# Patient Record
Sex: Female | Born: 1975 | Race: White | Hispanic: No | Marital: Married | Smoking: Never smoker
Health system: Southern US, Community
[De-identification: ages and names within clinical notes are randomized; demographics above are authoritative.]

## PROBLEM LIST (undated history)

## (undated) DIAGNOSIS — K219 Gastro-esophageal reflux disease without esophagitis: Secondary | ICD-10-CM

## (undated) HISTORY — PX: AUGMENTATION MAMMAPLASTY: SUR837

## (undated) HISTORY — DX: Gastro-esophageal reflux disease without esophagitis: K21.9

---

## 2003-12-04 HISTORY — PX: AUGMENTATION MAMMAPLASTY: SUR837

## 2004-12-03 HISTORY — PX: HEMORRHOID SURGERY: SHX153

## 2005-04-19 ENCOUNTER — Other Ambulatory Visit: Admission: RE | Admit: 2005-04-19 | Discharge: 2005-04-19 | Payer: Self-pay | Admitting: Obstetrics and Gynecology

## 2005-06-11 ENCOUNTER — Ambulatory Visit (HOSPITAL_COMMUNITY): Admission: RE | Admit: 2005-06-11 | Discharge: 2005-06-11 | Payer: Self-pay | Admitting: General Surgery

## 2005-06-17 ENCOUNTER — Emergency Department (HOSPITAL_COMMUNITY): Admission: EM | Admit: 2005-06-17 | Discharge: 2005-06-17 | Payer: Self-pay | Admitting: Emergency Medicine

## 2005-12-03 HISTORY — PX: LAPAROSCOPIC CHOLECYSTECTOMY: SUR755

## 2005-12-03 HISTORY — PX: GALLBLADDER SURGERY: SHX652

## 2013-03-16 ENCOUNTER — Ambulatory Visit (INDEPENDENT_AMBULATORY_CARE_PROVIDER_SITE_OTHER): Payer: No Typology Code available for payment source | Admitting: Obstetrics and Gynecology

## 2013-03-16 ENCOUNTER — Encounter: Payer: Self-pay | Admitting: *Deleted

## 2013-03-16 ENCOUNTER — Encounter: Payer: Self-pay | Admitting: Obstetrics and Gynecology

## 2013-03-16 VITALS — BP 110/60 | Ht 63.0 in | Wt 132.0 lb

## 2013-03-16 DIAGNOSIS — Z0000 Encounter for general adult medical examination without abnormal findings: Secondary | ICD-10-CM

## 2013-03-16 DIAGNOSIS — Z01419 Encounter for gynecological examination (general) (routine) without abnormal findings: Secondary | ICD-10-CM

## 2013-03-16 MED ORDER — NORGESTREL-ETHINYL ESTRADIOL 0.3-30 MG-MCG PO TABS: 1.0000 | ORAL_TABLET | Freq: Every day | ORAL | Status: DC

## 2013-03-16 NOTE — Patient Instructions (Signed)

## 2013-03-16 NOTE — Progress Notes (Signed)
37 y.o.  Married  Caucasian female   G2P2002 here for annual exam.  On lo-ovral.  Was on Loestrin 1.5/30 but had heavy bleeding and changed about a year ago to lo-ovral, menses currently fairly normal flow, only one heavy day, 3 light days.    Patient's last menstrual period was 03/09/2013.          Sexually active: yes  The current method of family planning is OCP (estrogen/progesterone).husband has a vasectomy Exercising: cardio,runner, interval training 6 days a week Last mammogram:  never Last pap smear:01/05/11 neg History of abnormal pap: no Smoking:no Alcohol: occ alcohol (mix drink) Last colonoscopy:never Last Bone Density: never  Last tetanus shot: not sure Last cholesterol check: not sure  Hgb:  At blood drive 12/6107         Urine:neg    No health maintenance topics applied.  Family History  Problem Relation Age of Onset  . Hypertension Father   . Ovarian cancer Maternal Aunt     both sisters died of ovarian ca/ mother neg  . Alzheimer's disease Maternal Grandmother   . Alzheimer's disease Maternal Grandfather   . Heart failure Paternal Grandfather     There is no problem list on file for this patient.   History reviewed. No pertinent past medical history.  Past Surgical History  Procedure Laterality Date  . Augmentation mammaplasty      2005  . Hemorrhoid surgery  2006  . Gallbladder surgery  2007    Allergies: Review of patient's allergies indicates no known allergies.  Current Outpatient Prescriptions  Medication Sig Dispense Refill  . CRYSELLE-28 0.3-30 MG-MCG tablet       . fluticasone (FLONASE) 50 MCG/ACT nasal spray       . Multiple Vitamin (MULTI-VITAMINS PO) Take by mouth daily.      . Probiotic Product (PROBIOTIC DAILY PO) Take by mouth daily.       No current facility-administered medications for this visit.    ROS: Pertinent items are noted in HPI.  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 , boys are in 4th and 8 th  grade  Exam:    BP 110/60  Ht 5\' 3"  (1.6 m)  Wt 132 lb (59.875 kg)  BMI 23.39 kg/m2  LMP 03/09/2013  Up one pound from last year Wt Readings from Last 3 Encounters:  03/16/13 132 lb (59.875 kg)     Ht Readings from Last 3 Encounters:  03/16/13 5\' 3"  (1.6 m)    General appearance: alert, cooperative and appears stated age Head: Normocephalic, without obvious abnormality, atraumatic Neck: no adenopathy, supple, symmetrical, trachea midline and thyroid not enlarged, symmetric, no tenderness/mass/nodules Lungs: clear to auscultation bilaterally Breasts: Inspection negative, No nipple retraction or dimpling, No nipple discharge or bleeding, No axillary or supraclavicular adenopathy, Normal to palpation without dominant masses Heart: regular rate and rhythm Abdomen: soft, non-tender; bowel sounds normal; no masses,  no organomegaly Extremities: extremities normal, atraumatic, no cyanosis or edema Skin: Skin color, texture, turgor normal. No rashes or lesions Lymph nodes: Cervical, supraclavicular, and axillary nodes normal. No abnormal inguinal nodes palpated Neurologic: Grossly normal   Pelvic: External genitalia:  no lesions              Urethra:  normal appearing urethra with no masses, tenderness or lesions              Bartholins and Skenes: normal  Vagina: normal appearing vagina with normal color and discharge, no lesions              Cervix: normal appearance              Pap taken: no        Bimanual Exam:  Uterus:  uterus is normal size, shape, consistency and nontender, midposition, mobile                                      Adnexa: normal adnexa in size, nontender and no masses                                                                          Anus:  normal sphincter tone, no lesions  A: normal gyn exam, OC's     P: mammogram counseled on breast self exam, adequate intake of calcium and vitamin D, diet and exercise return annually or prn      An After Visit Summary was printed and given to the patient.

## 2013-04-13 ENCOUNTER — Other Ambulatory Visit: Payer: Self-pay | Admitting: *Deleted

## 2013-04-13 NOTE — Telephone Encounter (Signed)
Faxed refill request received from pharmacy for 90 day supply Lo/Ovral Last filled by MD on 05/01/12, thru 01/2013 Last AEX - 01/28/12 Next AEX - not scheduled Please advise refills.  Chart on your desk.  Thanks.

## 2013-04-13 NOTE — Telephone Encounter (Signed)
No refills.  Return fax to pharmacy telling them to tell pt to call the office to schedule her AnEx.

## 2013-04-14 NOTE — Telephone Encounter (Signed)
Left patient a message to call office and schedule appt.

## 2013-04-15 ENCOUNTER — Telehealth: Payer: Self-pay | Admitting: *Deleted

## 2013-04-15 NOTE — Telephone Encounter (Signed)
I left patient a message to call and schedule with the front desk staff for her annual exam she is overdue and [facility] has sent a request to refill her script.

## 2013-04-16 ENCOUNTER — Encounter: Payer: Self-pay | Admitting: Obstetrics and Gynecology

## 2013-04-28 NOTE — Telephone Encounter (Signed)
Pt returned call.  Call documented in 04/15/13 note.

## 2014-03-19 ENCOUNTER — Ambulatory Visit: Payer: No Typology Code available for payment source | Admitting: Obstetrics and Gynecology

## 2014-03-19 ENCOUNTER — Encounter: Payer: Self-pay | Admitting: Obstetrics and Gynecology

## 2014-05-03 ENCOUNTER — Telehealth: Payer: Self-pay | Admitting: Obstetrics and Gynecology

## 2014-05-03 NOTE — Telephone Encounter (Signed)
I will send to Dr Edward Jolly. Since she doesn't have any clue as to when she "canceled" it i dont think she really did.  Dr Edward Jolly: do you want to waive this fee or does the fee apply?

## 2014-05-03 NOTE — Telephone Encounter (Signed)
Patient is calling about a no show charge she got. She says she has called about this before but never heard anything back from anyone. She claims she called and canceled the appointment but doesn't have any idea when she did that. Said she canceled because her insurance changed.

## 2014-05-03 NOTE — Telephone Encounter (Signed)
Can you tell me if this was approved by Dr. Edward Jolly to be waived?

## 2014-05-05 NOTE — Telephone Encounter (Signed)
I waived the fee and called the patient to let her know.

## 2014-05-05 NOTE — Telephone Encounter (Signed)
Remove the cancel fee, and make a note in the computer regarding this.

## 2014-05-05 NOTE — Telephone Encounter (Signed)
Emily Winters here is Dr Courtney Paris answer

## 2014-10-04 ENCOUNTER — Encounter: Payer: Self-pay | Admitting: Obstetrics and Gynecology

## 2014-12-03 HISTORY — PX: OTHER SURGICAL HISTORY: SHX169

## 2016-02-22 ENCOUNTER — Encounter: Payer: Self-pay | Admitting: Physician Assistant

## 2016-02-22 ENCOUNTER — Ambulatory Visit (INDEPENDENT_AMBULATORY_CARE_PROVIDER_SITE_OTHER): Payer: BLUE CROSS/BLUE SHIELD | Admitting: Physician Assistant

## 2016-02-22 VITALS — BP 120/72 | HR 69 | Ht 62.0 in | Wt 129.0 lb

## 2016-02-22 DIAGNOSIS — Z131 Encounter for screening for diabetes mellitus: Secondary | ICD-10-CM | POA: Diagnosis not present

## 2016-02-22 DIAGNOSIS — M222X2 Patellofemoral disorders, left knee: Secondary | ICD-10-CM

## 2016-02-22 DIAGNOSIS — K2100 Gastro-esophageal reflux disease with esophagitis, without bleeding: Secondary | ICD-10-CM

## 2016-02-22 DIAGNOSIS — Z13220 Encounter for screening for lipoid disorders: Secondary | ICD-10-CM | POA: Diagnosis not present

## 2016-02-22 DIAGNOSIS — M222X9 Patellofemoral disorders, unspecified knee: Secondary | ICD-10-CM | POA: Insufficient documentation

## 2016-02-22 MED ORDER — RANITIDINE HCL 300 MG PO TABS: 300.0000 mg | ORAL_TABLET | Freq: Two times a day (BID) | ORAL | Status: DC

## 2016-02-22 MED ORDER — OMEPRAZOLE 20 MG PO CPDR: 20.0000 mg | DELAYED_RELEASE_CAPSULE | Freq: Every day | ORAL | Status: DC

## 2016-02-22 MED ORDER — DICLOFENAC SODIUM 2 % TD SOLN: TRANSDERMAL | Status: DC

## 2016-02-22 NOTE — Progress Notes (Signed)
Subjective:    Patient ID: Emily Winters, female    DOB: [birth year], 40 y.o.   MRN: [id number]  HPI Pt is a 40 yo female who presents to the clinic to establish care.  .. Active Ambulatory Problems    Diagnosis Date Noted  . 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 Gastroesophageal reflux disease with esophagitis 02/22/2016   Resolved Ambulatory Problems    Diagnosis Date Noted  . No Resolved Ambulatory Problems   No Additional Past Medical History   .Marland Kitchen. Family History  Problem Relation Age of Onset  . Hypertension Father   . Alcohol abuse Father   . Cancer Father   . Hyperlipidemia Father   . Ovarian cancer Maternal Aunt     both sisters died of ovarian ca/ mother neg  . Alcohol abuse Maternal Aunt   . Cancer Maternal Aunt   . Alzheimer's disease Maternal Grandmother   . Alzheimer's disease Maternal Grandfather   . Alcohol abuse Maternal Grandfather   . Heart failure Paternal Grandfather   . Heart attack Paternal Grandfather   . Stroke Paternal Grandfather   . Hyperlipidemia Mother   . Crohn's disease Brother   . Cancer Maternal Aunt    .Marland Kitchen. Social History   Social History  . Marital Status: Married    Spouse Name: N/A  . Number of Children: N/A  . Years of Education: N/A   Occupational History  . Not on file.   Social History Main Topics  . Smoking status: Never Smoker   . Smokeless tobacco: Not on file  . Alcohol Use: 0.5 oz/week    1 Standard drinks or equivalent per week     Comment: occ alcohol(mix drink)  . Drug Use: No  . Sexual Activity:    Partners: Male    Birth Control/ Protection: Pill     Comment: lo ovral 28 day and husband has a vacectomy   Other Topics Concern  . Not on file   Social History Narrative   ** Merged History Encounter **       Pt needs refills on omeprazole for GERD. She has been out and symptoms not controlled. She has heard negative things about PPI long term and has questions.   She does have off and on knee  pain. Left worse than right. Has seen ortho and said it was patellofemoral syndrome. She continues to do crossfit and run. Uses ibuprofen often and does help.    Review of Systems  All other systems reviewed and are negative.      Objective:   Physical Exam  Constitutional: She is oriented to person, place, and time. She appears well-developed and well-nourished.  HENT:  Head: Normocephalic and atraumatic.  Cardiovascular: Normal rate, regular rhythm and normal heart sounds.   Pulmonary/Chest: Effort normal and breath sounds normal.  Neurological: She is alert and oriented to person, place, and time.  Psychiatric: She has a normal mood and affect. Her behavior is normal.          Assessment & Plan:  GERD- refilled omperazole. Discussed to use as needed. Try zantac at dinner and can take twice a day. If controls can get off omeprazole. Concerned NSAID use could be worsening reflux. Stop NSAIDs. Sent topical for patient to try for knee pain.   Patellofemoral syndrome- continue using support of knee with exercising. Stop oral NSAIDs. See if helps reflux. Try topical pennsaid bid. Exercises as needed. Follow up as needed.   Screening lipid and cmp ordered.  Pap and OB done at [facility] Dr. Cliffton Asters. Will call to get records.

## 2016-02-22 NOTE — Patient Instructions (Signed)
Patellofemoral Pain Syndrome  Patellofemoral pain syndrome is a condition that involves a softening or breakdown of the tissue (cartilage) on the underside of your kneecap (patella). This causes pain in the front of the knee. The condition is also called runner's knee or chondromalacia patella. Patellofemoral pain syndrome is most common in young adults who are active in sports.  Your knee is the largest joint in your body. The patella covers the front of your knee and is attached to muscles above and below your knee. The underside of the patella is covered with a smooth type of cartilage (synovium). The smooth surface helps the patella glide easily when you move your knee. Patellofemoral pain syndrome causes swelling in the joint linings and bone surfaces in your knee.   CAUSES   Patellofemoral pain syndrome can be caused by:   Overuse.   Poor alignment of your knee joints.   Weak leg muscles.   A direct blow to your kneecap.  RISK FACTORS  You may be at risk for patellofemoral pain syndrome if you:   Do a lot of activities that can wear down your kneecap. These include:    Running.    Squatting.    Climbing stairs.   Start a new physical activity or exercise program.   Wear shoes that do not fit well.   Do not have good leg strength.   Are overweight.  SIGNS AND SYMPTOMS   Knee pain is the most common symptom of patellofemoral pain syndrome. This may feel like a dull, aching pain underneath your patella, in the front of your knee. There may be a popping or cracking sound when you move your knee. Pain may get worse with:   Exercise.   Climbing stairs.   Running.   Jumping.   Squatting.   Kneeling.   Sitting for a long time.   Moving or pushing on your patella.  DIAGNOSIS   Your health care provider may be able to diagnose patellofemoral pain syndrome from your symptoms and medical history. You may be asked about your recent physical activities and which ones cause knee pain. Your health care  provider may do a physical exam with certain tests to confirm the diagnosis. These may include:   Moving your patella back and forth.   Checking your range of knee motion.   Having you squat or jump to see if you have pain.   Checking the strength of your leg muscles.  An MRI of the knee may also be done.  TREATMENT   Patellofemoral pain syndrome can usually be treated at home with rest, ice, compression, and elevation (RICE). Other treatments may include:   Nonsteroidal anti-inflammatory drugs (NSAIDs).   Physical therapy to stretch and strengthen your leg muscles.   Shoe inserts (orthotics) to take stress off your knee.   A knee brace or knee support.   Surgery to remove damaged cartilage or move the patella to a better position. The need for surgery is rare.  HOME CARE INSTRUCTIONS    Take medicines only as directed by your health care provider.   Rest your knee.   When resting, keep your knee raised above the level of your heart.   Avoid activities that cause knee pain.   Apply ice to the injured area:   Put ice in a plastic bag.   Place a towel between your skin and the bag.   Leave the ice on for 20 minutes, 2-3 times a day.   Use splints,   braces, knee supports, or walking aids as directed by your health care provider.   Perform stretching and strengthening exercises as directed by your health care provider or physical therapist.   Keep all follow-up visits as directed by your health care provider. This is important.  SEEK MEDICAL CARE IF:    Your symptoms get worse.   You are not improving with home care.  MAKE SURE YOU:   Understand these instructions.   Will watch your condition.   Will get help right away if you are not doing well or get worse.     This information is not intended to replace advice given to you by your health care provider. Make sure you discuss any questions you have with your health care provider.     Document Released: 11/07/2009 Document Revised: 12/10/2014  Document Reviewed: 02/08/2014  Elsevier Interactive Patient Education 2016 [employer]

## 2016-03-20 ENCOUNTER — Other Ambulatory Visit: Payer: Self-pay | Admitting: Physician Assistant

## 2016-03-27 ENCOUNTER — Encounter: Payer: Self-pay | Admitting: Physician Assistant

## 2016-06-06 ENCOUNTER — Other Ambulatory Visit: Payer: Self-pay | Admitting: Physician Assistant

## 2016-08-16 LAB — COMPLETE METABOLIC PANEL WITH GFR
ALT: 23 U/L (ref 6–29)
AST: 27 U/L (ref 10–30)
Albumin: 4.7 g/dL (ref 3.6–5.1)
Alkaline Phosphatase: 56 U/L (ref 33–115)
BUN: 14 mg/dL (ref 7–25)
CALCIUM: 9.7 mg/dL (ref 8.6–10.2)
CHLORIDE: 104 mmol/L (ref 98–110)
CO2: 26 mmol/L (ref 20–31)
CREATININE: 1 mg/dL (ref 0.50–1.10)
GFR, Est African American: 81 mL/min (ref >=60)
GFR, Est Non African American: 70 mL/min (ref >=60)
GLUCOSE: 92 mg/dL (ref 65–99)
Potassium: 4.3 mmol/L (ref 3.5–5.3)
SODIUM: 140 mmol/L (ref 135–146)
Total Bilirubin: 1.8 mg/dL — ABNORMAL HIGH (ref 0.2–1.2)
Total Protein: 7.1 g/dL (ref 6.1–8.1)

## 2016-08-16 LAB — LIPID PANEL
CHOLESTEROL: 180 mg/dL (ref 125–200)
HDL: 60 mg/dL (ref >=46)
LDL CALC: 109 mg/dL (ref <130)
Total CHOL/HDL Ratio: 3 Ratio (ref <=5.0)
Triglycerides: 54 mg/dL (ref <150)
VLDL: 11 mg/dL (ref <30)

## 2016-08-28 ENCOUNTER — Telehealth: Payer: Self-pay | Admitting: *Deleted

## 2016-08-28 DIAGNOSIS — Z1239 Encounter for other screening for malignant neoplasm of breast: Secondary | ICD-10-CM

## 2016-08-28 NOTE — Telephone Encounter (Signed)
Mammo ordered.

## 2016-08-29 ENCOUNTER — Other Ambulatory Visit: Payer: Self-pay | Admitting: Physician Assistant

## 2016-11-07 ENCOUNTER — Ambulatory Visit: Payer: BLUE CROSS/BLUE SHIELD | Admitting: Family Medicine

## 2016-11-08 ENCOUNTER — Encounter: Payer: Self-pay | Admitting: Emergency Medicine

## 2016-11-08 ENCOUNTER — Emergency Department
Admission: EM | Admit: 2016-11-08 | Discharge: 2016-11-08 | Disposition: A | Discharge status: Home / Self Care | Payer: BLUE CROSS/BLUE SHIELD | Source: Home / Self Care | Attending: Family Medicine | Admitting: Family Medicine

## 2016-11-08 DIAGNOSIS — J0100 Acute maxillary sinusitis, unspecified: Secondary | ICD-10-CM

## 2016-11-08 MED ORDER — AMOXICILLIN-POT CLAVULANATE 875-125 MG PO TABS: 1.0000 | ORAL_TABLET | Freq: Two times a day (BID) | ORAL | 0 refills | Status: DC

## 2016-11-08 NOTE — ED Triage Notes (Signed)
12 days, Sinus pain, pressure, headache, cough, ears hurt

## 2016-11-08 NOTE — ED Provider Notes (Signed)
CSN: [id number]     Arrival date & time 11/08/16  1844 History   First MD Initiated Contact with Patient 11/08/16 1859     Chief Complaint  Patient presents with  . Sinus Problem   (Consider location/radiation/quality/duration/timing/severity/associated sxs/prior Treatment) HPI  Emily Winters is a 40 y.o. female presenting to UC with c/o persistent sinus pain and pressure for about 12 days, associated mild intermittent productive cough, generalized headache and bilateral ear pain and pressure. She has taken OTC cough/cold medications with mild to moderate relief.  She notes others have been sick around her for Thanksgiving.  Denies fever, chills, n/v/d. Hx of sinus infections a few years ago.    History reviewed. No pertinent past medical history. Past Surgical History:  Procedure Laterality Date  . AUGMENTATION MAMMAPLASTY Bilateral 2005  . AUGMENTATION MAMMAPLASTY     2005  . GALLBLADDER SURGERY  2007  . HEMORRHOID SURGERY  2006  . LAPAROSCOPIC CHOLECYSTECTOMY  2007  . uterine ablation  2016   Family History  Problem Relation Age of Onset  . Hypertension Father   . Alcohol abuse Father   . Cancer Father   . Hyperlipidemia Father   . Ovarian cancer Maternal Aunt     both sisters died of ovarian ca/ mother neg  . Alcohol abuse Maternal Aunt   . Cancer Maternal Aunt   . Alzheimer's disease Maternal Grandmother   . Alzheimer's disease Maternal Grandfather   . Alcohol abuse Maternal Grandfather   . Heart failure Paternal Grandfather   . Heart attack Paternal Grandfather   . Stroke Paternal Grandfather   . Hyperlipidemia Mother   . Crohn's disease Brother   . Cancer Maternal Aunt    Social History  Substance Use Topics  . Smoking status: Never Smoker  . Smokeless tobacco: Never Used  . Alcohol use 0.5 oz/week    1 Standard drinks or equivalent per week     Comment: occ alcohol(mix drink)   OB History    Gravida Para Term Preterm AB Living   4 4 4     2    SAB TAB  Ectopic Multiple Live Births                 Review of Systems  Constitutional: Negative for chills and fever.  HENT: Positive for congestion, sinus pain and sinus pressure. Negative for ear pain, sore throat, trouble swallowing and voice change.   Respiratory: Positive for cough. Negative for shortness of breath.   Cardiovascular: Negative for chest pain and palpitations.  Gastrointestinal: Negative for abdominal pain, diarrhea, nausea and vomiting.  Musculoskeletal: Negative for arthralgias, back pain and myalgias.  Skin: Negative for rash.  Neurological: Positive for headaches ( frontal). Negative for dizziness and light-headedness.    Allergies  Patient has no known allergies.  Home Medications   Prior to Admission medications   Medication Sig Start Date End Date Taking? Authorizing Provider  amoxicillin-clavulanate (AUGMENTIN) 875-125 MG tablet Take 1 tablet by mouth 2 (two) times daily. One po bid x 7 days 11/08/16   Junius FinnerErin O'Malley, PA-C  Cholecalciferol (VITAMIN D-3) 1000 units CAPS Take by mouth daily.    Historical Provider, MD  Misc Natural Products (GLUCOSAMINE CHONDROITIN MSM PO) Take by mouth 3 (three) times daily.    Historical Provider, MD  Multiple Vitamin (MULTI-VITAMINS PO) Take by mouth daily.    Historical Provider, MD  omeprazole (PRILOSEC) 20 MG capsule Take 1 capsule (20 mg total) by mouth daily. 02/22/16   Lesly RubensteinJade  L Breeback, PA-C  PENNSAID 2 % SOLN APPLY TOPICALLY TWICE DAILY TO THE AFFECTED KNEES 03/20/16   Jade L Breeback, PA-C  Probiotic Product (PROBIOTIC DAILY PO) Take by mouth daily.    Historical Provider, MD  ranitidine (ZANTAC) 300 MG tablet TAKE 1 TABLET(300 MG) BY MOUTH TWICE DAILY 08/29/16   Jade L Breeback, PA-C  Turmeric 500 MG CAPS Take by mouth 2 (two) times daily.    Historical Provider, MD   Meds Ordered and Administered this Visit  Medications - No data to display  BP 128/86 (BP Location: Left Arm)   Pulse 64   Temp 97.8 F (36.6 C) (Oral)    Ht 5\' 2"  (1.575 m)   Wt 126 lb (57.2 kg)   SpO2 100%   BMI 23.05 kg/m  No data found.   Physical Exam  Constitutional: She appears well-developed and well-nourished. No distress.  HENT:  Head: Normocephalic and atraumatic.  Right Ear: Tympanic membrane normal.  Left Ear: Tympanic membrane normal.  Nose: Mucosal edema present. Right sinus exhibits maxillary sinus tenderness. Right sinus exhibits no frontal sinus tenderness. Left sinus exhibits maxillary sinus tenderness. Left sinus exhibits no frontal sinus tenderness.  Mouth/Throat: Uvula is midline, oropharynx is clear and moist and mucous membranes are normal.  Eyes: Conjunctivae are normal. No scleral icterus.  Neck: Normal range of motion. Neck supple.  Cardiovascular: Normal rate, regular rhythm and normal heart sounds.   Pulmonary/Chest: Effort normal and breath sounds normal. No stridor. No respiratory distress. She has no wheezes. She has no rales.  Abdominal: Soft. She exhibits no distension. There is no tenderness.  Musculoskeletal: Normal range of motion.  Lymphadenopathy:    She has no cervical adenopathy.  Neurological: She is alert.  Skin: Skin is warm and dry. She is not diaphoretic.  Nursing note and vitals reviewed.   Urgent Care Course   Clinical Course     Procedures (including critical care time)  Labs Review Labs Reviewed - No data to display  Imaging Review No results found.    MDM   1. Acute non-recurrent maxillary sinusitis    Pt c/o 12 days of worsening sinus pain, pressure and congestion. Sinus tenderness on exam. Rx: augmentin Home care instructions provided. F/u with PCP in 7-10 days as needed.    Junius Finnerrin O'Malley, PA-C 11/08/16 2003

## 2016-11-28 ENCOUNTER — Other Ambulatory Visit: Payer: Self-pay | Admitting: Physician Assistant

## 2017-11-15 ENCOUNTER — Encounter: Payer: Self-pay | Admitting: Physician Assistant

## 2017-11-15 ENCOUNTER — Ambulatory Visit (INDEPENDENT_AMBULATORY_CARE_PROVIDER_SITE_OTHER): Payer: 59 | Admitting: Physician Assistant

## 2017-11-15 VITALS — BP 125/79 | HR 79 | Ht 62.0 in | Wt 134.0 lb

## 2017-11-15 DIAGNOSIS — R5383 Other fatigue: Secondary | ICD-10-CM | POA: Diagnosis not present

## 2017-11-15 DIAGNOSIS — G5601 Carpal tunnel syndrome, right upper limb: Secondary | ICD-10-CM | POA: Diagnosis not present

## 2017-11-15 DIAGNOSIS — Z79899 Other long term (current) drug therapy: Secondary | ICD-10-CM

## 2017-11-15 DIAGNOSIS — Z1231 Encounter for screening mammogram for malignant neoplasm of breast: Secondary | ICD-10-CM | POA: Diagnosis not present

## 2017-11-15 DIAGNOSIS — N760 Acute vaginitis: Secondary | ICD-10-CM

## 2017-11-15 MED ORDER — RANITIDINE HCL 300 MG PO TABS: ORAL_TABLET | ORAL | 3 refills | Status: DC

## 2017-11-15 MED ORDER — FLUCONAZOLE 150 MG PO TABS: 150.0000 mg | ORAL_TABLET | Freq: Once | ORAL | 0 refills | Status: AC

## 2017-11-15 NOTE — Progress Notes (Addendum)
Subjective:    Patient ID: Emily Winters, female    DOB: 04/11/1976, 41 y.o.   MRN: [id number]  HPI  Patient is a 41 y/o female who present for numbness and tingling in her right hand. She states that it started about 3 years ago as numbness and tingling in her hand and arm when sleeping, however the symptoms were relieved with change in position. Lately she has not had relief with a change in position and reports numbness and tingling in her right middle finger. She describes it as worse when she is on the phone or sleeping. She denies pain or weakness in her right hand or fingers, as well as numbness or tingling from her neck down her arm, pain from her neck to her arm, or elbow pain. She reports neck pain but states that she has always had neck pain and does not attribute her symptoms to this. She has not tried OTC NSAIDs or braces. Her mother has a history of cubital tunnel syndrome for which she had cubital tunnel release.  Patient also reports history of yeast infections, particularly in the fall. She reports itching and white discharge. She has done 2 monostat treatments recently and reports some relief of symptoms but that it typically returns within a week. The last time she used monostat was last night.  Patient also reports decreased energy and would to have labs drawn to assess her fatigue.  .. Active Ambulatory Problems    Diagnosis Date Noted  . 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 Gastroesophageal reflux disease with esophagitis 02/22/2016   Resolved Ambulatory Problems    Diagnosis Date Noted  . No Resolved Ambulatory Problems   No Additional Past Medical History     Review of Systems See HPI, all other systems reviewed are negative.    Objective:   Physical Exam  Constitutional: She is oriented to person, place, and time. She appears well-developed and well-nourished.  HENT:  Head: Normocephalic and atraumatic.  Neck: Normal range of motion.  Grip strength  and sensation normal in bilateral hands.  Musculoskeletal:  Right wrist: Inspection normal with no visible erythema or swelling. ROM normal with good flexion and extension. Palpation is normal without tenderness. Negative tinel's over the right median nerve and positive phalen's which illicits numbness/tingling in her right middle finger.  Neurological: She is alert and oriented to person, place, and time.  Skin: Skin is warm and dry. No rash noted. No erythema.  Psychiatric: She has a normal mood and affect. Her behavior is normal. Thought content normal.      Assessment & Plan:  Marland Kitchen.Marland Kitchen.Diagnoses and all orders for this visit:  Visit for screening mammogram -     MM SCREENING BREAST TOMO BILATERAL  No energy -     COMPLETE METABOLIC PANEL WITH GFR -     CBC -     C-reactive protein -     Ferritin -     Sedimentation rate -     TSH -     Vitamin B12 -     Vit D  25 hydroxy (rtn osteoporosis monitoring)  Medication management -     COMPLETE METABOLIC PANEL WITH GFR  Acute vaginitis -     fluconazole (DIFLUCAN) 150 MG tablet; Take 1 tablet (150 mg total) by mouth once for 1 dose. Repeat in 48-72 hours if symptoms.  Other orders -     ranitidine (ZANTAC) 300 MG tablet; TAKE 1 TABLET(300 MG) BY MOUTH TWICE DAILY  Carpal tunnel syndrome - Suspect carpal tunnel syndrome because the patient is having numbness and tingling in her right middle finger that originates in her wrist but no symptoms in her right arm or neck. Ibuprofen 800 mg 3 times daily for relief of pain and symptoms. Advised patient to use night splints while sleeping to prevent wrist flexion and exacerbation of symptoms. Discussed that if she continues to have symptoms that we can refer her for EMG to determine confirm which nerves are affected. Also discussed referral to sports medicine for steroid injections or hydrodissection of the carpal tunnel if she does not experience relief of symptoms with conservative measures. Patient  will follow-up with sports medicine in 4 weeks if she does not have relief of symptoms.   Recurrent yeast infections - Patient reports yearly yeast infections in the fall and is currently symptomatic despite 2 treatments with OTC monostat. Prescribed Diflucan and advised patient to take one tablet, if her symptoms do not resolve with one dose then she can take a second dose 48-72 hrs later.

## 2017-11-15 NOTE — Patient Instructions (Signed)
Carpal Tunnel Syndrome Carpal tunnel syndrome is a condition that causes pain in your hand and arm. The carpal tunnel is a narrow area that is on the palm side of your wrist. Repeated wrist motion or certain diseases may cause swelling in the tunnel. This swelling can pinch the main nerve in the wrist (median nerve). Follow these instructions at home: If you have a splint:  Wear it as told by your doctor. Remove it only as told by your doctor.  Loosen the splint if your fingers: ? Become numb and tingle. ? Turn blue and cold.  Keep the splint clean and dry. General instructions  Take over-the-counter and prescription medicines only as told by your doctor.  Rest your wrist from any activity that may be causing your pain. If needed, talk to your employer about changes that can be made in your work, such as getting a wrist pad to use while typing.  If directed, apply ice to the painful area: ? Put ice in a plastic bag. ? Place a towel between your skin and the bag. ? Leave the ice on for 20 minutes, 2-3 times per day.  Keep all follow-up visits as told by your doctor. This is important.  Do any exercises as told by your doctor, physical therapist, or occupational therapist. Contact a doctor if:  You have new symptoms.  Medicine does not help your pain.  Your symptoms get worse. This information is not intended to replace advice given to you by your health care provider. Make sure you discuss any questions you have with your health care provider. Document Released: 11/08/2011 Document Revised: 04/26/2016 Document Reviewed: 04/06/2015 Elsevier Interactive Patient Education  2018 Elsevier Inc.  

## 2017-11-17 DIAGNOSIS — G5601 Carpal tunnel syndrome, right upper limb: Secondary | ICD-10-CM | POA: Insufficient documentation

## 2018-12-16 ENCOUNTER — Telehealth: Payer: Self-pay

## 2018-12-16 MED ORDER — FAMOTIDINE 20 MG PO TABS: 20.0000 mg | ORAL_TABLET | Freq: Two times a day (BID) | ORAL | 3 refills | Status: DC

## 2018-12-16 NOTE — Telephone Encounter (Signed)
Patient called today in need of a refill on Zantac. Due to recent recall, Zantac cannot be refilled. PCP advised and approved a prescription of Pepcid 20 mg be called in her pharmacy. Prescription was ordered and patient has been notified of medication change, and the reason for the change.   No further questions or concerns at this time.

## 2019-10-18 ENCOUNTER — Other Ambulatory Visit: Payer: Self-pay | Admitting: Physician Assistant

## 2019-10-27 ENCOUNTER — Other Ambulatory Visit: Payer: Self-pay

## 2019-10-27 ENCOUNTER — Ambulatory Visit (INDEPENDENT_AMBULATORY_CARE_PROVIDER_SITE_OTHER): Payer: 59 | Admitting: Physician Assistant

## 2019-10-27 VITALS — BP 129/76 | HR 73 | Ht 62.0 in | Wt 134.0 lb

## 2019-10-27 DIAGNOSIS — Z1231 Encounter for screening mammogram for malignant neoplasm of breast: Secondary | ICD-10-CM

## 2019-10-27 DIAGNOSIS — Z13220 Encounter for screening for lipoid disorders: Secondary | ICD-10-CM

## 2019-10-27 DIAGNOSIS — Z23 Encounter for immunization: Secondary | ICD-10-CM

## 2019-10-27 DIAGNOSIS — R160 Hepatomegaly, not elsewhere classified: Secondary | ICD-10-CM | POA: Diagnosis not present

## 2019-10-27 NOTE — Patient Instructions (Signed)
Please get labs and will order liver ultrasound.

## 2019-10-27 NOTE — Progress Notes (Signed)
Subjective:    Patient ID: Emily Winters, female    DOB: [birth year], 43 y.o.   MRN: [id number]  HPI  Patient is a 43 year old female with GERD who presents the clinic to discuss a abnormal finding on her MRI of lumbar spine.  She has been having some low back pain in her orthopedic specialist ordered an MRI.  Incidentally it also found a rather large liver measuring 23 cm.  Orthopedist suggested follow-up.  Patient has not been in the clinic in quite a while.  She has not had any fasting labs.  She denies any fatigue, leg swelling, abdominal bloating, itching, rashes.  She denies any excessive Tylenol usage.  She opts to use more NSAIDs.  She denies any significant alcohol intake.  She drinks on average 3-4 drinks a week.  She denies any past or present IV drug use.  She is in a monogamous relationship.  She denies any family history of liver disease.  She does not have any known history of hepatitis.   .. Active Ambulatory Problems    Diagnosis Date Noted  . 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 Gastroesophageal reflux disease with esophagitis 02/22/2016  . 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 Hepatomegaly 10/28/2019   Resolved Ambulatory Problems    Diagnosis Date Noted  . No Resolved Ambulatory Problems   No Additional Past Medical History      Review of Systems  All other systems reviewed and are negative.      Objective:   Physical Exam Vitals signs reviewed.  Constitutional:      Appearance: Normal appearance.  HENT:     Head: Normocephalic and atraumatic.  Cardiovascular:     Rate and Rhythm: Normal rate and regular rhythm.     Pulses: Normal pulses.  Pulmonary:     Effort: Pulmonary effort is normal.     Breath sounds: Normal breath sounds.  Abdominal:     General: Abdomen is flat. Bowel sounds are normal.     Palpations: Abdomen is soft.     Comments: Hepatomegaly.   Musculoskeletal:     Right lower leg: No edema.     Left lower leg:  No edema.  Skin:    Findings: No rash.  Neurological:     General: No focal deficit present.     Mental Status: She is alert and oriented to person, place, and time.  Psychiatric:        Mood and Affect: Mood normal.           Assessment & Plan:  Marland KitchenMarland KitchenSeona was seen today for advice only.  Diagnoses and all orders for this visit:  Hepatomegaly -     Hepatitis, Acute -     Hepatic function panel -     BASIC METABOLIC PANEL WITH GFR -     CBC -     PT/INR -     US Abdomen Complete -     Lipid Panel w/reflex Direct LDL  Need for Tdap vaccination -     TDAP  Encounter for screening mammogram for malignant neoplasm of breast -     MM 3D SCREEN BREAST BILATERAL  Screening for lipid disorders -     Lipid Panel w/reflex Direct LDL   Unclear etiology of increased liver size on MRI. No prior images to compare too. No past liver enzyme elevation. We will get a dedicated ultrasound of the abdomen to look at liver.  We will get some labs to  check for hepatitis, will get liver function, with good kidney function, CBC and anticoagulation.  Could be a normal variant Riedel's nodule.  Patient does not have any increased risk factors for cirrhosis such as obesity, alcohol, overuse of Tylenol.  Patient is asymptomatic at this time.  We will work this up and follow-up as needed.  Pt has not been seen in a while ordered screening lipid.

## 2019-10-28 ENCOUNTER — Encounter: Payer: Self-pay | Admitting: Physician Assistant

## 2019-10-28 DIAGNOSIS — R160 Hepatomegaly, not elsewhere classified: Secondary | ICD-10-CM | POA: Insufficient documentation

## 2019-11-04 ENCOUNTER — Other Ambulatory Visit: Payer: Self-pay

## 2019-11-04 ENCOUNTER — Ambulatory Visit (INDEPENDENT_AMBULATORY_CARE_PROVIDER_SITE_OTHER): Payer: 59

## 2019-11-04 DIAGNOSIS — R160 Hepatomegaly, not elsewhere classified: Secondary | ICD-10-CM | POA: Diagnosis not present

## 2019-11-04 NOTE — Progress Notes (Signed)
GREAT news. No evidence of enlarged liver. Normal liver.

## 2019-11-05 LAB — HEPATIC FUNCTION PANEL
AG Ratio: 2 (calc) (ref 1.0–2.5)
ALT: 13 U/L (ref 6–29)
AST: 19 U/L (ref 10–35)
Albumin: 4.5 g/dL (ref 3.6–5.1)
Alkaline phosphatase (APISO): 39 U/L (ref 31–125)
Bilirubin, Direct: 0.3 mg/dL — ABNORMAL HIGH (ref 0.0–0.2)
Globulin: 2.2 g/dL (calc) (ref 1.9–3.7)
Indirect Bilirubin: 1.4 mg/dL (calc) — ABNORMAL HIGH (ref 0.2–1.2)
Total Bilirubin: 1.7 mg/dL — ABNORMAL HIGH (ref 0.2–1.2)
Total Protein: 6.7 g/dL (ref 6.1–8.1)

## 2019-11-05 LAB — BASIC METABOLIC PANEL WITH GFR
BUN: 12 mg/dL (ref 7–25)
CO2: 26 mmol/L (ref 20–32)
Calcium: 9.9 mg/dL (ref 8.6–10.2)
Chloride: 105 mmol/L (ref 98–110)
Creat: 1.03 mg/dL (ref 0.50–1.10)
GFR, Est African American: 76 mL/min/{1.73_m2} (ref >=60)
GFR, Est Non African American: 66 mL/min/{1.73_m2} (ref >=60)
Glucose, Bld: 89 mg/dL (ref 65–99)
Potassium: 4.5 mmol/L (ref 3.5–5.3)
Sodium: 140 mmol/L (ref 135–146)

## 2019-11-05 LAB — HEPATITIS PANEL, ACUTE
Hep A IgM: NONREACTIVE
Hep B C IgM: NONREACTIVE
Hepatitis B Surface Ag: NONREACTIVE
Hepatitis C Ab: NONREACTIVE
SIGNAL TO CUT-OFF: 0.01 (ref <1.00)

## 2019-11-05 LAB — CBC
HCT: 43.2 % (ref 35.0–45.0)
Hemoglobin: 14.5 g/dL (ref 11.7–15.5)
MCH: 31.6 pg (ref 27.0–33.0)
MCHC: 33.6 g/dL (ref 32.0–36.0)
MCV: 94.1 fL (ref 80.0–100.0)
MPV: 10.6 fL (ref 7.5–12.5)
Platelets: 259 10*3/uL (ref 140–400)
RBC: 4.59 10*6/uL (ref 3.80–5.10)
RDW: 11.3 % (ref 11.0–15.0)
WBC: 5 10*3/uL (ref 3.8–10.8)

## 2019-11-05 LAB — LIPID PANEL W/REFLEX DIRECT LDL
Cholesterol: 178 mg/dL (ref <200)
HDL: 62 mg/dL (ref >=50)
LDL Cholesterol (Calc): 101 mg/dL (calc) — ABNORMAL HIGH
Non-HDL Cholesterol (Calc): 116 mg/dL (calc) (ref <130)
Total CHOL/HDL Ratio: 2.9 (calc) (ref <5.0)
Triglycerides: 67 mg/dL (ref <150)

## 2019-11-05 LAB — PROTIME-INR
INR: 1
Prothrombin Time: 10.5 s (ref 9.0–11.5)

## 2019-11-05 NOTE — Progress Notes (Signed)
Call pt: cholesterol looks great. CBC looks good. Kidney looks great. Bilirubin is up a little but stable from 3 years ago. Not concerning with normal liver enzymes.

## 2019-11-06 NOTE — Progress Notes (Signed)
Emily Winters,   Hepatitis panel negative as well.

## 2019-11-19 ENCOUNTER — Ambulatory Visit (INDEPENDENT_AMBULATORY_CARE_PROVIDER_SITE_OTHER): Payer: 59

## 2019-11-19 ENCOUNTER — Other Ambulatory Visit: Payer: Self-pay

## 2019-11-19 DIAGNOSIS — Z1231 Encounter for screening mammogram for malignant neoplasm of breast: Secondary | ICD-10-CM | POA: Diagnosis not present

## 2019-11-22 NOTE — Progress Notes (Signed)
Normal mammogram. Follow up in one year.

## 2020-05-24 ENCOUNTER — Other Ambulatory Visit: Payer: Self-pay | Admitting: Physician Assistant

## 2020-05-27 ENCOUNTER — Other Ambulatory Visit: Payer: Self-pay | Admitting: Neurology

## 2020-05-27 MED ORDER — FAMOTIDINE 20 MG PO TABS: ORAL_TABLET | ORAL | 1 refills | Status: DC

## 2021-01-11 ENCOUNTER — Other Ambulatory Visit: Payer: Self-pay | Admitting: Neurology

## 2021-01-11 MED ORDER — FAMOTIDINE 20 MG PO TABS: ORAL_TABLET | ORAL | 0 refills | Status: DC

## 2021-03-15 ENCOUNTER — Other Ambulatory Visit: Payer: Self-pay | Admitting: Physician Assistant

## 2021-03-29 ENCOUNTER — Other Ambulatory Visit: Payer: Self-pay | Admitting: Physician Assistant

## 2021-09-04 ENCOUNTER — Other Ambulatory Visit: Payer: Self-pay | Admitting: Physician Assistant

## 2021-09-04 DIAGNOSIS — Z1231 Encounter for screening mammogram for malignant neoplasm of breast: Secondary | ICD-10-CM

## 2021-09-06 ENCOUNTER — Ambulatory Visit (INDEPENDENT_AMBULATORY_CARE_PROVIDER_SITE_OTHER): Payer: 59

## 2021-09-06 ENCOUNTER — Other Ambulatory Visit: Payer: Self-pay

## 2021-09-06 DIAGNOSIS — Z1231 Encounter for screening mammogram for malignant neoplasm of breast: Secondary | ICD-10-CM | POA: Diagnosis not present

## 2021-09-10 NOTE — Progress Notes (Signed)
Normal mammogram. Follow up in 1 year.

## 2021-09-18 ENCOUNTER — Other Ambulatory Visit: Payer: Self-pay

## 2021-09-18 ENCOUNTER — Ambulatory Visit (INDEPENDENT_AMBULATORY_CARE_PROVIDER_SITE_OTHER): Payer: 59 | Admitting: Physician Assistant

## 2021-09-18 ENCOUNTER — Encounter: Payer: Self-pay | Admitting: Physician Assistant

## 2021-09-18 VITALS — BP 124/76 | HR 65 | Temp 98.5°F | Wt 130.1 lb

## 2021-09-18 DIAGNOSIS — Z79899 Other long term (current) drug therapy: Secondary | ICD-10-CM

## 2021-09-18 DIAGNOSIS — K219 Gastro-esophageal reflux disease without esophagitis: Secondary | ICD-10-CM

## 2021-09-18 DIAGNOSIS — Z1211 Encounter for screening for malignant neoplasm of colon: Secondary | ICD-10-CM

## 2021-09-18 DIAGNOSIS — Z131 Encounter for screening for diabetes mellitus: Secondary | ICD-10-CM | POA: Diagnosis not present

## 2021-09-18 DIAGNOSIS — Z1329 Encounter for screening for other suspected endocrine disorder: Secondary | ICD-10-CM

## 2021-09-18 DIAGNOSIS — Z13220 Encounter for screening for lipoid disorders: Secondary | ICD-10-CM | POA: Diagnosis not present

## 2021-09-18 MED ORDER — FAMOTIDINE 20 MG PO TABS: ORAL_TABLET | ORAL | 3 refills | Status: AC

## 2021-09-18 NOTE — Progress Notes (Signed)
   Subjective:    Patient ID: Emily Winters, female    DOB: [birth year], 45 y.o.   MRN: [id number]  HPI Pt is a 45 yo female with GERD who presents to the clinic for refills on famotidine.   Pt is doing well. GERD is controlled. No concerns or complaints.   .. 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     Objective:   Physical Exam Vitals reviewed.  Constitutional:      Appearance: Normal appearance.  HENT:     Head: Normocephalic.  Neck:     Vascular: No carotid bruit.  Cardiovascular:     Rate and Rhythm: Normal rate and regular rhythm.     Pulses: Normal pulses.     Heart sounds: Normal heart sounds.  Pulmonary:     Effort: Pulmonary effort is normal.     Breath sounds: Normal breath sounds.  Abdominal:     General: Bowel sounds are normal. There is no distension.     Palpations: Abdomen is soft. There is no mass.     Tenderness: There is no abdominal tenderness. There is no right CVA tenderness, left CVA tenderness, guarding or rebound.  Musculoskeletal:     Cervical back: Normal range of motion and neck supple. No tenderness.     Right lower leg: No edema.     Left lower leg: No edema.  Lymphadenopathy:     Cervical: No cervical adenopathy.  Neurological:     General: No focal deficit present.     Mental Status: She is alert and oriented to person, place, and time.  Psychiatric:        Mood and Affect: Mood normal.          Assessment & Plan:  Marland KitchenMarland KitchenSaraiya was seen today for medication  refill.  Diagnoses and all orders for this visit:  Gastroesophageal reflux disease without esophagitis -     famotidine (PEPCID) 20 MG tablet; TAKE 1 TABLET BY MOUTH TWICE DAILY.  Screening for lipid disorders -     Lipid Panel w/reflex Direct LDL  Screening for diabetes mellitus -     COMPLETE METABOLIC PANEL WITH GFR  Screening for thyroid disorder -     TSH  Medication management -     TSH -     Lipid Panel w/reflex Direct LDL -     COMPLETE METABOLIC PANEL WITH GFR -     CBC with Differential/Platelet  Colon cancer screening -     Ambulatory referral to Gastroenterology  Vitals look great.  Pepcid refilled for 1 year.  Fasting labs ordered.  Pt needs colonoscopy. Ordered today.  Encouraged to get pap smear.  Mammogram is UTD.  Pt declined all vaccines today covid/flu.

## 2021-09-18 NOTE — Patient Instructions (Signed)
Vitamin D 1000-2000 units a day and calcium 1300mg  or 4 servings.

## 2021-09-19 ENCOUNTER — Encounter: Payer: Self-pay | Admitting: Physician Assistant

## 2021-11-16 LAB — CBC WITH DIFFERENTIAL/PLATELET
Absolute Monocytes: 290 cells/uL (ref 200–950)
Basophils Absolute: 40 cells/uL (ref 0–200)
Basophils Relative: 0.8 %
Eosinophils Absolute: 40 cells/uL (ref 15–500)
Eosinophils Relative: 0.8 %
HCT: 43.5 % (ref 35.0–45.0)
Hemoglobin: 14.6 g/dL (ref 11.7–15.5)
Lymphs Abs: 1860 cells/uL (ref 850–3900)
MCH: 32.2 pg (ref 27.0–33.0)
MCHC: 33.6 g/dL (ref 32.0–36.0)
MCV: 95.8 fL (ref 80.0–100.0)
MPV: 10.3 fL (ref 7.5–12.5)
Monocytes Relative: 5.8 %
Neutro Abs: 2770 cells/uL (ref 1500–7800)
Neutrophils Relative %: 55.4 %
Platelets: 237 10*3/uL (ref 140–400)
RBC: 4.54 10*6/uL (ref 3.80–5.10)
RDW: 11.1 % (ref 11.0–15.0)
Total Lymphocyte: 37.2 %
WBC: 5 10*3/uL (ref 3.8–10.8)

## 2021-11-16 LAB — COMPLETE METABOLIC PANEL WITH GFR
AG Ratio: 2.1 (calc) (ref 1.0–2.5)
ALT: 16 U/L (ref 6–29)
AST: 20 U/L (ref 10–35)
Albumin: 4.6 g/dL (ref 3.6–5.1)
Alkaline phosphatase (APISO): 51 U/L (ref 31–125)
BUN/Creatinine Ratio: 16 (calc) (ref 6–22)
BUN: 16 mg/dL (ref 7–25)
CO2: 27 mmol/L (ref 20–32)
Calcium: 9.7 mg/dL (ref 8.6–10.2)
Chloride: 104 mmol/L (ref 98–110)
Creat: 1.01 mg/dL — ABNORMAL HIGH (ref 0.50–0.99)
Globulin: 2.2 g/dL (calc) (ref 1.9–3.7)
Glucose, Bld: 82 mg/dL (ref 65–99)
Potassium: 4.3 mmol/L (ref 3.5–5.3)
Sodium: 138 mmol/L (ref 135–146)
Total Bilirubin: 1.8 mg/dL — ABNORMAL HIGH (ref 0.2–1.2)
Total Protein: 6.8 g/dL (ref 6.1–8.1)
eGFR: 69 mL/min/{1.73_m2} (ref >=60)

## 2021-11-16 LAB — LIPID PANEL W/REFLEX DIRECT LDL
Cholesterol: 188 mg/dL (ref <200)
HDL: 66 mg/dL (ref >=50)
LDL Cholesterol (Calc): 107 mg/dL (calc) — ABNORMAL HIGH
Non-HDL Cholesterol (Calc): 122 mg/dL (calc) (ref <130)
Total CHOL/HDL Ratio: 2.8 (calc) (ref <5.0)
Triglycerides: 66 mg/dL (ref <150)

## 2021-11-16 LAB — TSH: TSH: 0.82 mIU/L

## 2021-11-20 NOTE — Progress Notes (Signed)
Thyroid normal.  Normal hemoglobin.  Cholesterol stable and HDL looks great!

## 2021-11-29 ENCOUNTER — Telehealth: Payer: 59 | Admitting: Physician Assistant

## 2021-11-29 DIAGNOSIS — U071 COVID-19: Secondary | ICD-10-CM | POA: Diagnosis not present

## 2021-11-29 MED ORDER — ONDANSETRON HCL 4 MG PO TABS: 4.0000 mg | ORAL_TABLET | Freq: Three times a day (TID) | ORAL | 0 refills | Status: DC | PRN

## 2021-11-29 MED ORDER — FLUTICASONE PROPIONATE 50 MCG/ACT NA SUSP: 2.0000 | Freq: Every day | NASAL | 0 refills | Status: DC

## 2021-11-29 MED ORDER — MOLNUPIRAVIR EUA 200MG CAPSULE: 4.0000 | ORAL_CAPSULE | Freq: Two times a day (BID) | ORAL | 0 refills | Status: AC

## 2021-11-29 NOTE — Progress Notes (Signed)
Virtual Visit Consent   Emily Winters, you are scheduled for a virtual visit with a [facility] provider today.     Just as with appointments in the office, your consent must be obtained to participate.  Your consent will be active for this visit and any virtual visit you may have with one of our providers in the next 365 days.     If you have a MyChart account, a copy of this consent can be sent to you electronically.  All virtual visits are billed to your insurance company just like a traditional visit in the office.    As this is a virtual visit, video technology does not allow for your provider to perform a traditional examination.  This may limit your provider's ability to fully assess your condition.  If your provider identifies any concerns that need to be evaluated in person or the need to arrange testing (such as labs, EKG, etc.), we will make arrangements to do so.     Although advances in technology are sophisticated, we cannot ensure that it will always work on either your end or our end.  If the connection with a video visit is poor, the visit may have to be switched to a telephone visit.  With either a video or telephone visit, we are not always able to ensure that we have a secure connection.     I need to obtain your verbal consent now.   Are you willing to proceed with your visit today?    MARSHELLA TELLO has provided verbal consent on 11/29/2021 for a virtual visit (video or telephone).   Margaretann Loveless, PA-C   Date: 11/29/2021 2:32 PM   Virtual Visit via Video Note   I, Margaretann Loveless, connected with  Emily Winters  ([id number], 09-17-1976) on 11/29/21 at  2:30 PM EST by a video-enabled telemedicine application and verified that I am speaking with the correct person using two identifiers.  Location: Patient: Virtual Visit Location Patient: Home Provider: Virtual Visit Location Provider: Home Office   I discussed the limitations of evaluation and  management by telemedicine and the availability of in person appointments. The patient expressed understanding and agreed to proceed.    History of Present Illness: Emily Winters is a 45 y.o. who identifies as a female who was assigned female at birth, and is being seen today for Covid 21.  HPI: URI  This is a new problem. Episode onset: Symptoms started Monday; Tested positive for Covid 19 on Monday as well. The problem has been gradually worsening. Maximum temperature: temperature not taken but having hot/cold flashes. Associated symptoms include congestion, coughing, ear pain, headaches, nausea (mild), rhinorrhea and sinus pain. Pertinent negatives include no diarrhea, plugged ear sensation, sore throat or vomiting. Associated symptoms comments: Post nasal drainage, fatigue, myalgias. Treatments tried: ibuprofen, sudafed, tylenol. The treatment provided no relief.     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   Gastroesophageal reflux disease with esophagitis 02/22/2016    Allergies:  Allergies  Allergen Reactions   Other    Medications:  Current Outpatient Medications:    famotidine (PEPCID) 20 MG tablet, TAKE 1 TABLET BY MOUTH TWICE DAILY., Disp: 180 tablet, Rfl: 3   Misc Natural Products (GLUCOSAMINE CHONDROITIN MSM PO), Take by mouth 3 (three) times  daily., Disp: , Rfl:    Multiple Vitamin (MULTI-VITAMINS PO), Take by mouth daily., Disp: , Rfl:    Probiotic Product (PROBIOTIC DAILY PO), Take by mouth daily., Disp: , Rfl:    Turmeric 500 MG CAPS, Take by mouth 2 (two) times daily., Disp: , Rfl:   Observations/Objective: Patient is well-developed, well-nourished in no acute distress.  Resting comfortably at home.  Head is normocephalic, atraumatic.  No labored breathing.   Speech is clear and coherent with logical content.  Patient is alert and oriented at baseline.    Assessment and Plan: 1. COVID-19  - Continue OTC symptomatic management of choice - Will send OTC vitamins and supplement information through AVS - Molnupiravir, zofran and flonase prescribed - Patient enrolled in MyChart symptom monitoring - Push fluids - Rest as needed - Discussed return precautions and when to seek in-person evaluation, sent via AVS as well  Follow Up Instructions: I discussed the assessment and treatment plan with the patient. The patient was provided an opportunity to ask questions and all were answered. The patient agreed with the plan and demonstrated an understanding of the instructions.  A copy of instructions were sent to the patient via MyChart unless otherwise noted below.    The patient was advised to call back or seek an in-person evaluation if the symptoms worsen or if the condition fails to improve as anticipated.  Time:  I spent 13 minutes with the patient via telehealth technology discussing the above problems/concerns.    Margaretann Loveless, PA-C

## 2021-11-29 NOTE — Patient Instructions (Signed)
Emily Winters, thank you for joining Margaretann Loveless, PA-C for today's virtual visit.  While this provider is not your primary care provider (PCP), if your PCP is located in our provider database this encounter information will be shared with them immediately following your visit.  Consent: (Patient) Emily Winters provided verbal consent for this virtual visit at the beginning of the encounter.  Current Medications:  Current Outpatient Medications:    fluticasone (FLONASE) 50 MCG/ACT nasal spray, Place 2 sprays into both nostrils daily., Disp: 16 g, Rfl: 0   molnupiravir EUA (LAGEVRIO) 200 mg CAPS capsule, Take 4 capsules (800 mg total) by mouth 2 (two) times daily for 5 days., Disp: 40 capsule, Rfl: 0   ondansetron (ZOFRAN) 4 MG tablet, Take 1 tablet (4 mg total) by mouth every 8 (eight) hours as needed for nausea or vomiting., Disp: 20 tablet, Rfl: 0   famotidine (PEPCID) 20 MG tablet, TAKE 1 TABLET BY MOUTH TWICE DAILY., Disp: 180 tablet, Rfl: 3   Misc Natural Products (GLUCOSAMINE CHONDROITIN MSM PO), Take by mouth 3 (three) times daily., Disp: , Rfl:    Multiple Vitamin (MULTI-VITAMINS PO), Take by mouth daily., Disp: , Rfl:    Probiotic Product (PROBIOTIC DAILY PO), Take by mouth daily., Disp: , Rfl:    Turmeric 500 MG CAPS, Take by mouth 2 (two) times daily., Disp: , Rfl:    Medications ordered in this encounter:  Meds ordered this encounter  Medications   molnupiravir EUA (LAGEVRIO) 200 mg CAPS capsule    Sig: Take 4 capsules (800 mg total) by mouth 2 (two) times daily for 5 days.    Dispense:  40 capsule    Refill:  0    Order Specific Question:   Supervising Provider    Answer:   MILLER, BRIAN [[id number]]   ondansetron (ZOFRAN) 4 MG tablet    Sig: Take 1 tablet (4 mg total) by mouth every 8 (eight) hours as needed for nausea or vomiting.    Dispense:  20 tablet    Refill:  0    Order Specific Question:   Supervising Provider    Answer:   MILLER, BRIAN [[id number]]    fluticasone (FLONASE) 50 MCG/ACT nasal spray    Sig: Place 2 sprays into both nostrils daily.    Dispense:  16 g    Refill:  0    Order Specific Question:   Supervising Provider    Answer:   Hyacinth Meeker, BRIAN [[id number]]     *If you need refills on other medications prior to your next appointment, please contact your pharmacy*  Follow-Up: Call back or seek an in-person evaluation if the symptoms worsen or if the condition fails to improve as anticipated.  Other Instructions Molnupiravir Oral Capsules What is this medication? MOLNUPIRAVIR (mol nue pir a vir) treats COVID-19. It is an antiviral medication. It may decrease the risk of developing severe symptoms of COVID-19. It may also decrease the chance of going to the hospital. This medication is not approved by the FDA. The FDA has authorized emergency use of this medication during the COVID-19 pandemic. This medicine may be used for other purposes; ask your health care provider or pharmacist if you have questions. COMMON BRAND NAME(S): LAGEVRIO What should I tell my care team before I take this medication? They need to know if you have any of these conditions: Any allergies Any serious illness An unusual or allergic reaction to molnupiravir, other medications, foods, dyes, or preservatives Pregnant or trying  to get pregnant Breast-feeding How should I use this medication? Take this medication by mouth with water. Take it as directed on the prescription label at the same time every day. Do not cut, crush or chew this medication. Swallow the capsules whole. You can take it with or without food. If it upsets your stomach, take it with food. Take all of this medication unless your care team tells you to stop it early. Keep taking it even if you think you are better. Talk to your care team about the use of this medication in children. Special care may be needed. Overdosage: If you think you have taken too much of this medicine contact a poison  control center or emergency room at once. NOTE: This medicine is only for you. Do not share this medicine with others. What if I miss a dose? If you miss a dose, take it as soon as you can unless it is more than 10 hours late. If it is more than 10 hours late, skip the missed dose. Take the next dose at the normal time. Do not take extra or 2 doses at the same time to make up for the missed dose. What may interact with this medication? Interactions have not been studied. This list may not describe all possible interactions. Give your health care provider a list of all the medicines, herbs, non-prescription drugs, or dietary supplements you use. Also tell them if you smoke, drink alcohol, or use illegal drugs. Some items may interact with your medicine. What should I watch for while using this medication? Your condition will be monitored carefully while you are receiving this medication. Visit your care team for regular checkups. Tell your care team if your symptoms do not start to get better or if they get worse. Do not become pregnant while taking this medication. You may need a pregnancy test before starting this medication. Women must use a reliable form of birth control while taking this medication and for 4 days after stopping the medication. Women should inform their care team if they wish to become pregnant or think they might be pregnant. Men should not father a child while taking this medication and for 3 months after stopping it. There is potential for serious harm to an unborn child. Talk to your care team for more information. Do not breast-feed an infant while taking this medication and for 4 days after stopping the medication. What side effects may I notice from receiving this medication? Side effects that you should report to your care team as soon as possible: Allergic reactions--skin rash, itching, hives, swelling of the face, lips, tongue, or throat Side effects that usually do not  require medical attention (report these to your care team if they continue or are bothersome): Diarrhea Dizziness Nausea This list may not describe all possible side effects. Call your doctor for medical advice about side effects. You may report side effects to FDA at 1-800-FDA-1088. Where should I keep my medication? Keep out of the reach of children and pets. Store at room temperature between 20 and 25 degrees C (68 and 77 degrees F). Get rid of any unused medication after the expiration date. To get rid of medications that are no longer needed or have expired: Take the medication to a medication take-back program. Check with your pharmacy or law enforcement to find a location. If you cannot return the medication, check the label or package insert to see if the medication should be thrown out in the  garbage or flushed down the toilet. If you are not sure, ask your care team. If it is safe to put it in the trash, take the medication out of the container. Mix the medication with cat litter, dirt, coffee grounds, or other unwanted substance. Seal the mixture in a bag or container. Put it in the trash. NOTE: This sheet is a summary. It may not cover all possible information. If you have questions about this medicine, talk to your doctor, pharmacist, or health care provider.  2022 Elsevier/Gold Standard (2020-11-28 00:00:00)   10 Things You Can Do to Manage Your COVID-19 Symptoms at Home If you have possible or confirmed COVID-19 Stay home except to get medical care. Monitor your symptoms carefully. If your symptoms get worse, call your healthcare provider immediately. Get rest and stay hydrated. If you have a medical appointment, call the healthcare provider ahead of time and tell them that you have or may have COVID-19. For medical emergencies, call 911 and notify the dispatch personnel that you have or may have COVID-19. Cover your cough and sneezes with a tissue or use the inside of your  elbow. Wash your hands often with soap and water for at least 20 seconds or clean your hands with an alcohol-based hand sanitizer that contains at least 60% alcohol. As much as possible, stay in a specific room and away from other people in your home. Also, you should use a separate bathroom, if available. If you need to be around other people in or outside of the home, wear a mask. Avoid sharing personal items with other people in your household, like dishes, towels, and bedding. Clean all surfaces that are touched often, like counters, tabletops, and doorknobs. Use household cleaning sprays or wipes according to the label instructions. [email or web address] 06/17/2020 This information is not intended to replace advice given to you by your health care provider. Make sure you discuss any questions you have with your health care provider. Document Revised: 08/11/2021 Document Reviewed: 08/11/2021 Elsevier Patient Education  2022 [employer].    If you have been instructed to have an in-person evaluation today at a local Urgent Care facility, please use the link below. It will take you to a list of all of our available [facility], including address, phone number and hours of operation. Please do not delay care.  [facility]  If you or a family member do not have a primary care provider, use the link below to schedule a visit and establish care. When you choose a [facility] primary care physician or advanced practice provider, you gain a long-term partner in health. Find a Primary Care Provider  Learn more about [facility]'s in-office and virtual care options: [facility] - Get Care Now

## 2022-07-04 ENCOUNTER — Encounter: Payer: Self-pay | Admitting: Neurology

## 2022-08-29 ENCOUNTER — Telehealth: Payer: Self-pay | Admitting: General Practice

## 2022-08-29 NOTE — Telephone Encounter (Signed)
Transition Care Management Follow-up Telephone Call Date of discharge and from where: 08/28/22 from [facility] How have you been since you were released from the hospital? Doing a little better.  Any questions or concerns? No  Items Reviewed: Did the pt receive and understand the discharge instructions provided? Yes  Medications obtained and verified? Yes  Other? No  Any new allergies since your discharge? No  Dietary orders reviewed? Yes Do you have support at home? Yes   Home Care and Equipment/Supplies: Were home health services ordered? no  Functional Questionnaire: (I = Independent and D = Dependent) ADLs: I  Bathing/Dressing- I  Meal Prep- I  Eating- I  Maintaining continence- I  Transferring/Ambulation- I  Managing Meds- I  Follow up appointments reviewed:  PCP Hospital f/u appt confirmed? No   Specialist Hospital f/u appt confirmed? No   Are transportation arrangements needed? No  If their condition worsens, is the pt aware to call PCP or go to the Emergency Dept.? Yes Was the patient provided with contact information for the PCP's office or ED? Yes Was to pt encouraged to call back with questions or concerns? Yes

## 2022-12-31 ENCOUNTER — Encounter: Payer: Self-pay | Admitting: Gastroenterology

## 2023-01-03 ENCOUNTER — Ambulatory Visit (INDEPENDENT_AMBULATORY_CARE_PROVIDER_SITE_OTHER): Payer: Commercial Managed Care - PPO | Admitting: Podiatry

## 2023-01-03 DIAGNOSIS — L600 Ingrowing nail: Secondary | ICD-10-CM

## 2023-01-03 NOTE — Progress Notes (Signed)
  Subjective:  Patient ID: Emily Winters, female    DOB: [birth year],  MRN: [id number]  Chief Complaint  Patient presents with   Ingrown Toenail    47 y.o. female presents with the above complaint.  Patient presents with right hallux medial border ingrown painful to touch is progressive gotten worse worse with ambulation worse with pressure she would like to have it removed she has not seen anyone as prior to seeing me denies any other acute complaints pain scale 7 out of 10 dull achy in nature   Review of Systems: Negative except as noted in the HPI. Denies N/V/F/Ch.  No past medical history on file.  Current Outpatient Medications:    famotidine (PEPCID) 20 MG tablet, TAKE 1 TABLET BY MOUTH TWICE DAILY., Disp: 180 tablet, Rfl: 3   fluticasone (FLONASE) 50 MCG/ACT nasal spray, Place 2 sprays into both nostrils daily., Disp: 16 g, Rfl: 0   Misc Natural Products (GLUCOSAMINE CHONDROITIN MSM PO), Take by mouth 3 (three) times daily., Disp: , Rfl:    Multiple Vitamin (MULTI-VITAMINS PO), Take by mouth daily., Disp: , Rfl:    ondansetron (ZOFRAN) 4 MG tablet, Take 1 tablet (4 mg total) by mouth every 8 (eight) hours as needed for nausea or vomiting., Disp: 20 tablet, Rfl: 0   Probiotic Product (PROBIOTIC DAILY PO), Take by mouth daily., Disp: , Rfl:    Turmeric 500 MG CAPS, Take by mouth 2 (two) times daily., Disp: , Rfl:   Social History   Tobacco Use  Smoking Status Never  Smokeless Tobacco Never    Allergies  Allergen Reactions   Other    Objective:  There were no vitals filed for this visit. There is no height or weight on file to calculate BMI. Constitutional Well developed. Well nourished.  Vascular Dorsalis pedis pulses palpable bilaterally. Posterior tibial pulses palpable bilaterally. Capillary refill normal to all digits.  No cyanosis or clubbing noted. Pedal hair growth normal.  Neurologic Normal speech. Oriented to person, place, and time. Epicritic sensation  to light touch grossly present bilaterally.  Dermatologic Painful ingrowing nail at medial nail borders of the hallux nail right. No other open wounds. No skin lesions.  Orthopedic: Normal joint ROM without pain or crepitus bilaterally. No visible deformities. No bony tenderness.   Radiographs: None Assessment:   1. Ingrown toenail of right foot    Plan:  Patient was evaluated and treated and all questions answered.  Ingrown Nail, right -Patient elects to proceed with minor surgery to remove ingrown toenail removal today. Consent reviewed and signed by patient. -Ingrown nail excised. See procedure note. -Educated on post-procedure care including soaking. Written instructions provided and reviewed. -Patient to follow up in 2 weeks for nail check.  Procedure: Excision of Ingrown Toenail Location: Right 1st toe medial nail borders. Anesthesia: Lidocaine 1% plain; 1.5 mL and Marcaine 0.5% plain; 1.5 mL, digital block. Skin Prep: Betadine. Dressing: Silvadene; telfa; dry, sterile, compression dressing. Technique: Following skin prep, the toe was exsanguinated and a tourniquet was secured at the base of the toe. The affected nail border was freed, split with a nail splitter, and excised. Chemical matrixectomy was then performed with phenol and irrigated out with alcohol. The tourniquet was then removed and sterile dressing applied. Disposition: Patient tolerated procedure well. Patient to return in 2 weeks for follow-up.   No follow-ups on file.

## 2023-01-23 ENCOUNTER — Ambulatory Visit (AMBULATORY_SURGERY_CENTER): Payer: Commercial Managed Care - PPO

## 2023-01-23 VITALS — Ht 62.0 in | Wt 132.0 lb

## 2023-01-23 DIAGNOSIS — Z1211 Encounter for screening for malignant neoplasm of colon: Secondary | ICD-10-CM

## 2023-01-23 MED ORDER — NA SULFATE-K SULFATE-MG SULF 17.5-3.13-1.6 GM/177ML PO SOLN
1.0000 | Freq: Once | ORAL | 0 refills | Status: AC
Start: 2023-01-23 — End: 2023-01-23

## 2023-01-23 NOTE — Progress Notes (Signed)

## 2023-02-13 ENCOUNTER — Encounter: Payer: 59 | Admitting: Gastroenterology

## 2023-02-21 LAB — HM COLONOSCOPY

## 2023-03-22 ENCOUNTER — Encounter: Payer: Self-pay | Admitting: Physician Assistant

## 2023-05-27 ENCOUNTER — Ambulatory Visit: Payer: Commercial Managed Care - PPO | Admitting: Physician Assistant

## 2023-06-17 ENCOUNTER — Ambulatory Visit (INDEPENDENT_AMBULATORY_CARE_PROVIDER_SITE_OTHER): Payer: Commercial Managed Care - PPO | Admitting: Physician Assistant

## 2023-06-17 VITALS — BP 136/88 | HR 67 | Ht 62.0 in | Wt 130.0 lb

## 2023-06-17 DIAGNOSIS — R238 Other skin changes: Secondary | ICD-10-CM

## 2023-06-17 MED ORDER — TRIAMCINOLONE ACETONIDE 0.1 % EX CREA
1.0000 | TOPICAL_CREAM | Freq: Two times a day (BID) | CUTANEOUS | 0 refills | Status: DC
Start: 2023-06-17 — End: 2023-10-29

## 2023-06-17 NOTE — Progress Notes (Unsigned)
   Acute Office Visit  Subjective:     Patient ID: Emily Winters, female    DOB: 1975/12/12, 47 y.o.   MRN: [id number]  Chief Complaint  Patient presents with   Rash    Located on chest. Pt states it was swollen but has since gotten better but still wants it looked at.     HPI Patient is in today for a concerning area on her left upper chest wall for the last week. The area appeared suddenly and was more raised and swollen and covered area about the size of nickel.  Over the last day or so swelling resolved and area is getting smaller and almost completely resolved. No pain. It is a little "itchy". Does not remember any bug bites. Denies any recent sunburn. She has exfoliated her chest which is what seemed to help. No fever, chills, headaches, body aches.   .. 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   Hepatomegaly 10/28/2019   Gastroesophageal reflux disease without esophagitis 09/18/2021   Papule of skin 06/18/2023   Resolved Ambulatory Problems    Diagnosis Date Noted   No Resolved Ambulatory Problems   Past Medical History:  Diagnosis Date   GERD (gastroesophageal reflux disease)      ROS  See HPI>     Objective:    BP 136/88 (BP Location: Left Arm, Patient Position: Sitting, Cuff Size: Normal)   Pulse 67   Ht 5\' 2"  (1.575 m)   Wt 130 lb (59 kg)   SpO2 100%   BMI 23.78 kg/m  BP Readings from Last 3 Encounters:  06/17/23 136/88  09/18/21 124/76  10/27/19 129/76   Wt Readings from Last 3 Encounters:  06/17/23 130 lb (59 kg)  01/23/23 132 lb (59.9 kg)  09/18/21 130 lb 1.9 oz (59 kg)      Physical Exam No swelling noted today. What is left is small 1mm by 3mm slightly raised papule that is a little more erythematous than skin tone with irregular borders. Not warm or tender to touch.        Assessment & Plan:  Marland KitchenMarland KitchenAngelina "Angie" was seen today for rash.  Diagnoses and all orders for this visit:  Papule of skin -     triamcinolone cream (KENALOG) 0.1 %; Apply 1 Application topically 2 (two) times daily. As needed.  Unclear etiology, skin papule does seem to be resolving and getting smaller. Reassured not infected. Looks like could be almost a blister formation from a bite of some kind. ? If could be sebaceous cyst. Use topical steroid for next few days. If not improving, changing or worsening follow back up.     Tandy Gaw, PA-C

## 2023-06-18 ENCOUNTER — Encounter: Payer: Self-pay | Admitting: Physician Assistant

## 2023-06-18 DIAGNOSIS — R238 Other skin changes: Secondary | ICD-10-CM | POA: Insufficient documentation

## 2023-08-06 ENCOUNTER — Encounter: Payer: Self-pay | Admitting: Physician Assistant

## 2023-08-06 MED ORDER — FLUCONAZOLE 150 MG PO TABS: 150.0000 mg | ORAL_TABLET | Freq: Once | ORAL | 0 refills | Status: AC

## 2023-08-06 MED ORDER — FLUCONAZOLE 150 MG PO TABS: 150.0000 mg | ORAL_TABLET | Freq: Once | ORAL | 0 refills | Status: DC

## 2023-08-06 NOTE — Addendum Note (Signed)
Addended by: Chalmers Cater on: 08/06/2023 01:10 PM   Modules accepted: Orders

## 2023-08-30 LAB — LAB REPORT - SCANNED: EGFR: 77

## 2023-09-03 LAB — HEPATIC FUNCTION PANEL
ALT: 13 U/L (ref 7–35)
AST: 19 (ref 13–35)
Alkaline Phosphatase: 52 (ref 25–125)
Bilirubin, Total: 1.8

## 2023-09-03 LAB — BASIC METABOLIC PANEL
BUN: 15 (ref 4–21)
CO2: 23 — AB (ref 13–22)
Chloride: 103 (ref 99–108)
Creatinine: 0.9 (ref 0.5–1.1)
Glucose: 84
Potassium: 4.7 meq/L (ref 3.5–5.1)
Sodium: 140 (ref 137–147)

## 2023-09-03 LAB — LIPID PANEL
Cholesterol: 172 (ref 0–200)
HDL: 53 (ref 35–70)
LDL Cholesterol: 106
Triglycerides: 68 (ref 40–160)

## 2023-09-03 LAB — CBC AND DIFFERENTIAL
HCT: 44 (ref 36–46)
Hemoglobin: 14 (ref 12.0–16.0)
Neutrophils Absolute: 3
Platelets: 220 10*3/uL (ref 150–400)
WBC: 5.3

## 2023-09-03 LAB — VITAMIN B12: Vitamin B-12: 395

## 2023-09-03 LAB — TSH: TSH: 0.6 (ref 0.41–5.90)

## 2023-09-03 LAB — IRON,TIBC AND FERRITIN PANEL
Ferritin: 257
Iron: 119

## 2023-09-03 LAB — COMPREHENSIVE METABOLIC PANEL
Albumin: 4.4 (ref 3.5–5.0)
Calcium: 9.7 (ref 8.7–10.7)
Globulin: 2.3
eGFR: 77

## 2023-09-03 LAB — CBC: RBC: 4.57 (ref 3.87–5.11)

## 2023-09-03 LAB — VITAMIN D 25 HYDROXY (VIT D DEFICIENCY, FRACTURES): Vit D, 25-Hydroxy: 41.2

## 2023-09-03 LAB — TESTOSTERONE: Testosterone: 4

## 2023-09-22 IMAGING — MG DIGITAL SCREENING BREAST BILAT IMPLANT W/ TOMO W/ CAD
9 of 12 series · 9 of 28 positions shown · non-contrast
Comparison: Previous exam(s).

CLINICAL DATA: Screening.

EXAM:
DIGITAL SCREENING BILATERAL MAMMOGRAM WITH IMPLANTS, CAD AND
TOMOSYNTHESIS
TECHNIQUE: Bilateral screening digital craniocaudal and mediolateral oblique
mammograms were obtained. Bilateral screening digital breast
tomosynthesis was performed. The images were evaluated with
computer-aided detection. Standard and/or implant displaced views
were performed.

[L MLO]
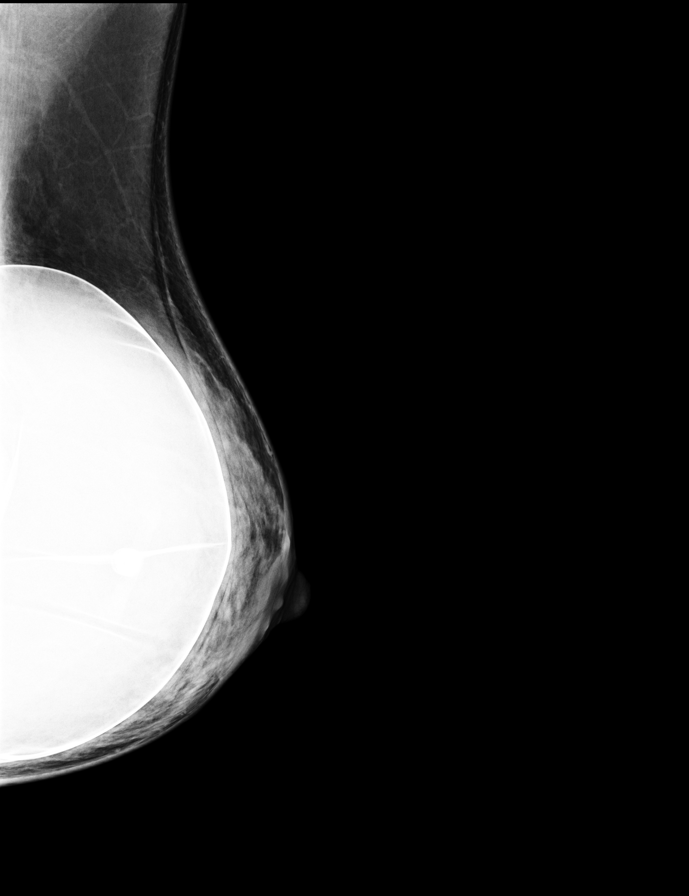

[R MLO]
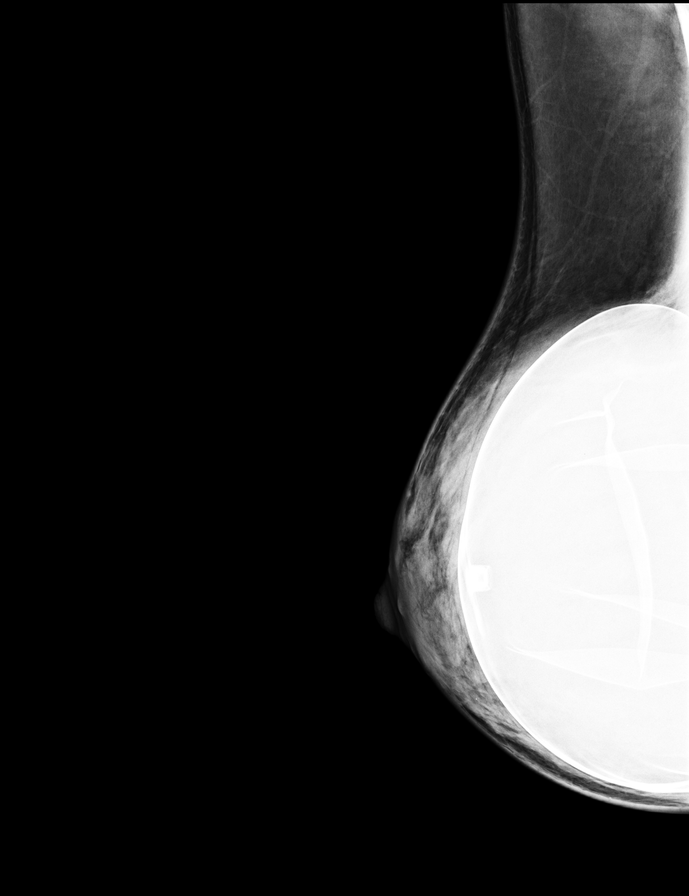

[R CC]
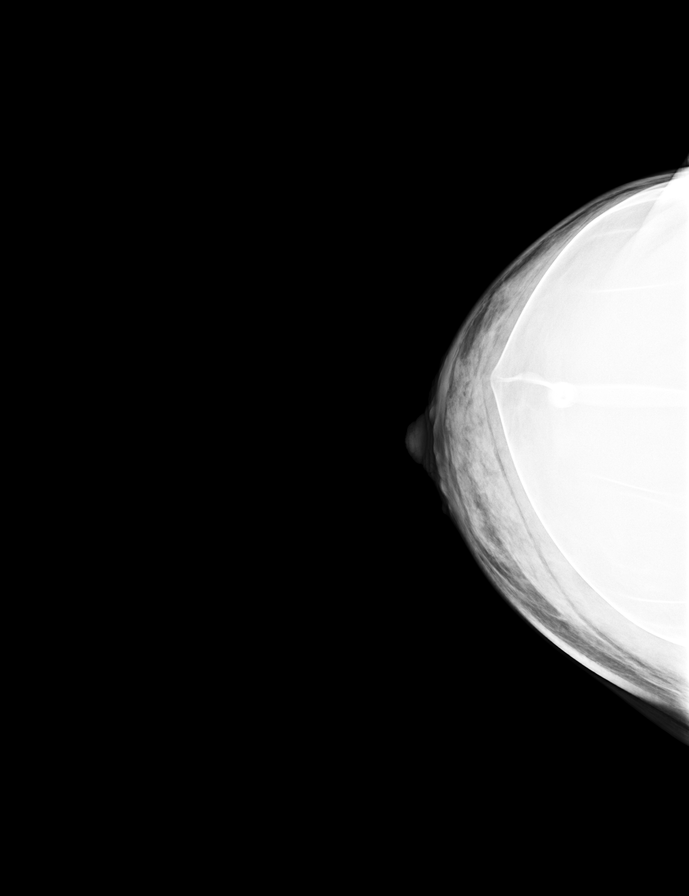

[L CC]
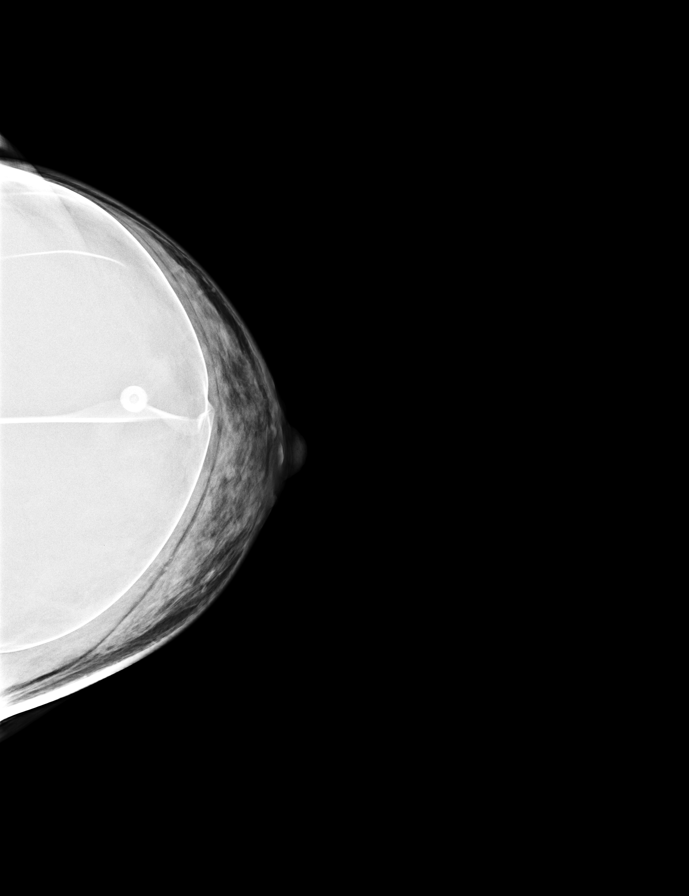

[L CC synth-2D]
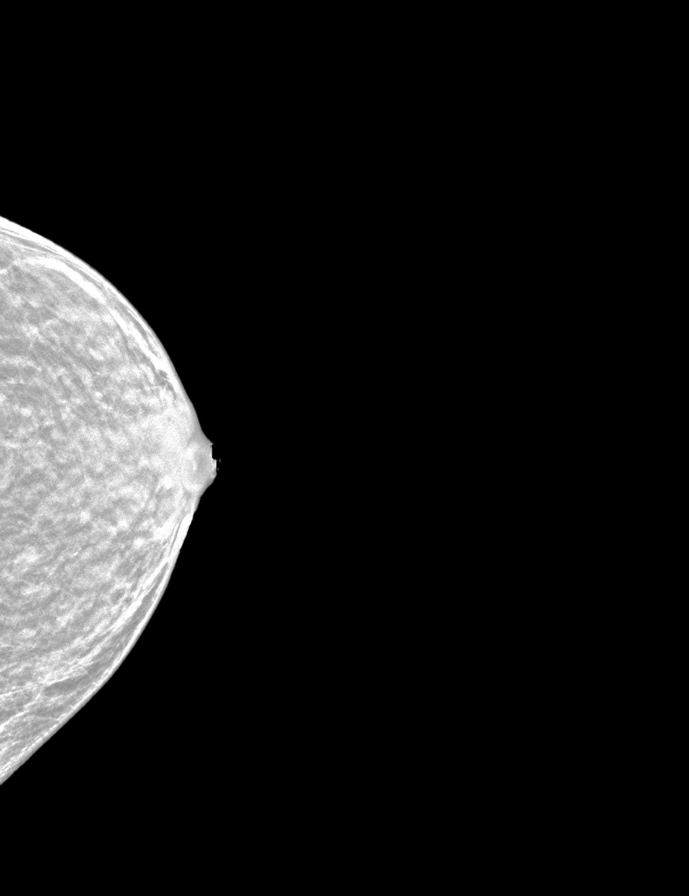

[L MLO synth-2D]
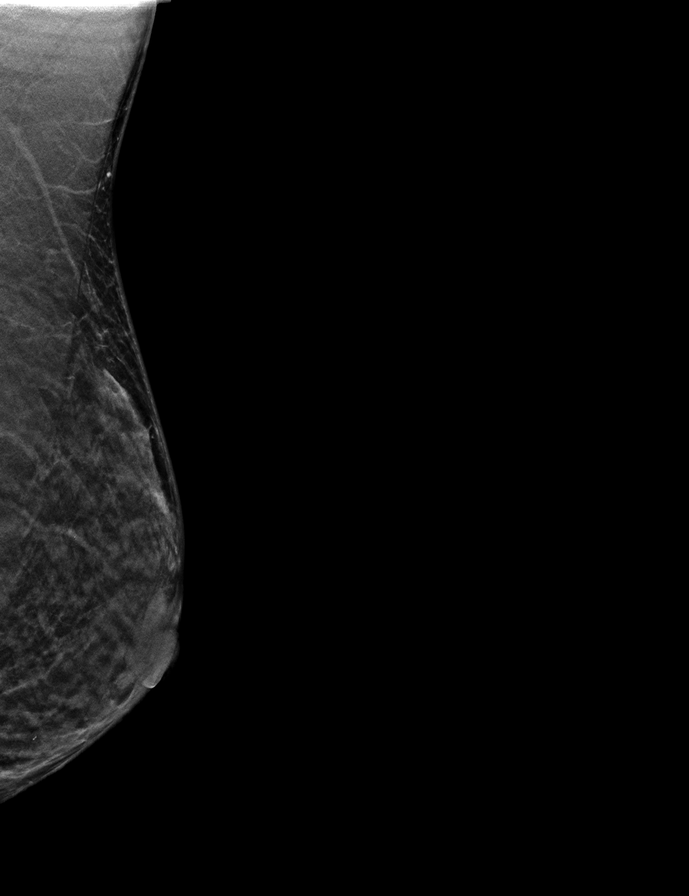

[R CC synth-2D]
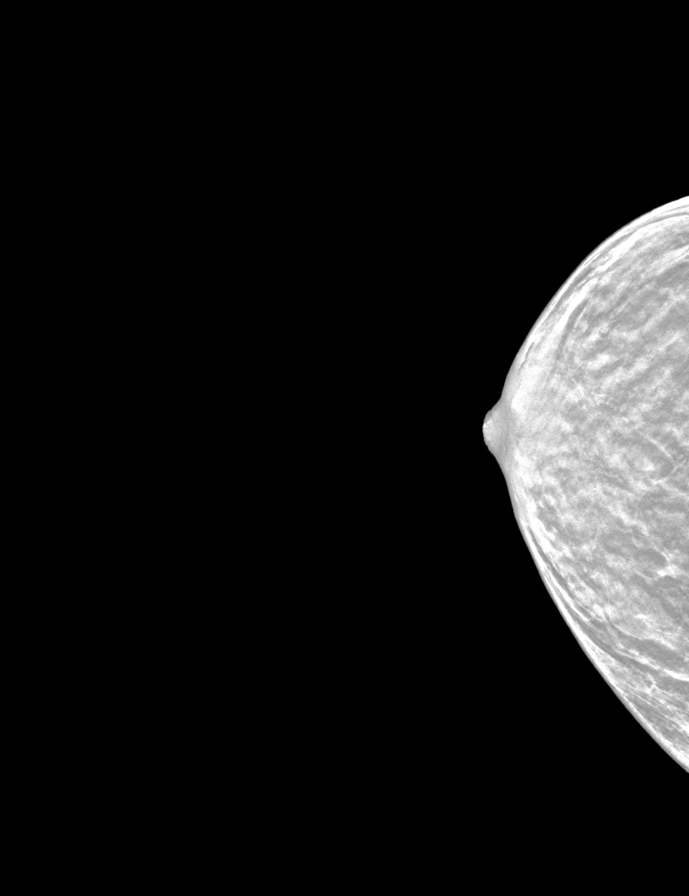

[R MLO synth-2D]
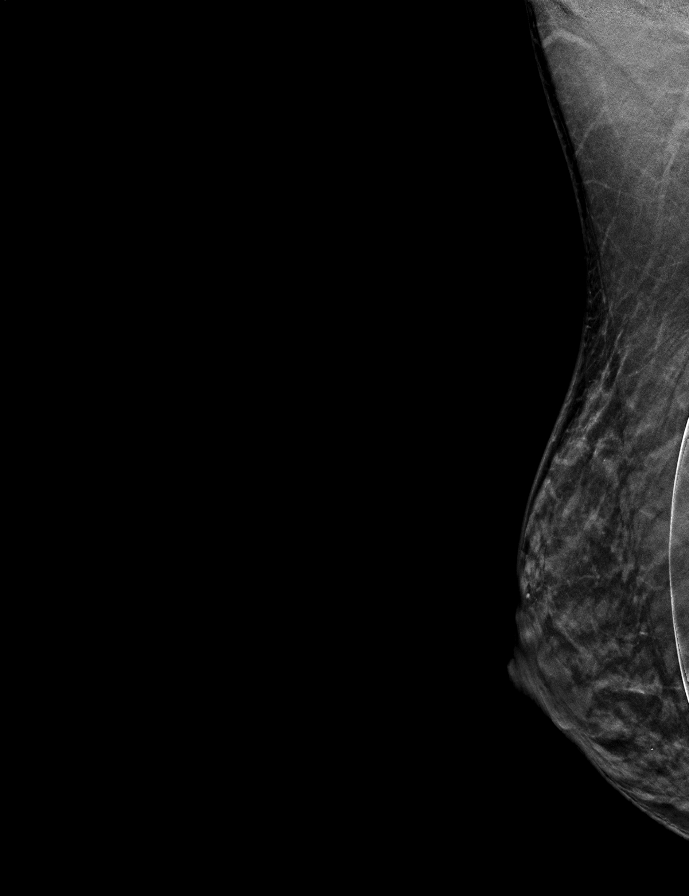

[R MLO tomo · tomo slice 17/33.0]
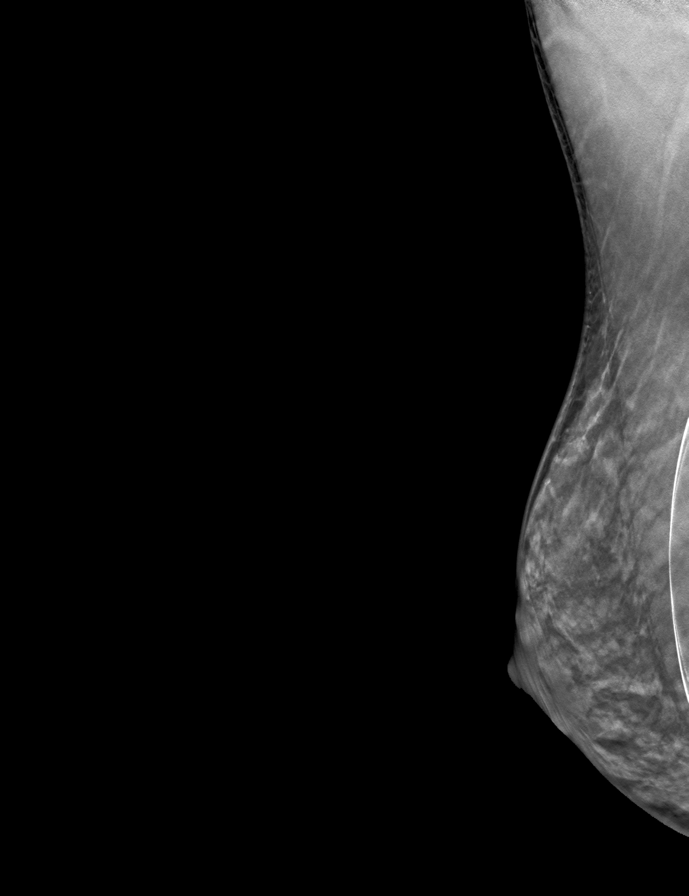

[9 of 28 positions shown; findings below may reference images not displayed]

ACR Breast Density Category d: The breast tissue is extremely dense,
which lowers the sensitivity of mammography.
FINDINGS: The patient has retropectoral implants. There are no findings
suspicious for malignancy.
IMPRESSION: No mammographic evidence of malignancy. A result letter of this
screening mammogram will be mailed directly to the patient.

RECOMMENDATION:
Screening mammogram in one year. (Code:GG-0-M5E)

BI-RADS CATEGORY  1:  Negative.

## 2023-10-29 ENCOUNTER — Ambulatory Visit (INDEPENDENT_AMBULATORY_CARE_PROVIDER_SITE_OTHER): Payer: Commercial Managed Care - PPO | Admitting: Physician Assistant

## 2023-10-29 ENCOUNTER — Encounter: Payer: Self-pay | Admitting: Physician Assistant

## 2023-10-29 VITALS — BP 112/69 | HR 75 | Temp 98.8°F | Resp 20 | Ht 62.0 in | Wt 128.7 lb

## 2023-10-29 DIAGNOSIS — E538 Deficiency of other specified B group vitamins: Secondary | ICD-10-CM

## 2023-10-29 DIAGNOSIS — L659 Nonscarring hair loss, unspecified: Secondary | ICD-10-CM | POA: Diagnosis not present

## 2023-10-29 DIAGNOSIS — R7989 Other specified abnormal findings of blood chemistry: Secondary | ICD-10-CM

## 2023-10-29 DIAGNOSIS — Z77120 Contact with and (suspected) exposure to mold (toxic): Secondary | ICD-10-CM

## 2023-10-29 DIAGNOSIS — Z1231 Encounter for screening mammogram for malignant neoplasm of breast: Secondary | ICD-10-CM | POA: Diagnosis not present

## 2023-10-29 DIAGNOSIS — E559 Vitamin D deficiency, unspecified: Secondary | ICD-10-CM | POA: Diagnosis not present

## 2023-10-29 DIAGNOSIS — L299 Pruritus, unspecified: Secondary | ICD-10-CM

## 2023-10-29 MED ORDER — CLOBETASOL PROPIONATE 0.05 % EX FOAM: Freq: Two times a day (BID) | CUTANEOUS | 1 refills | Status: AC

## 2023-10-29 MED ORDER — CLOBETASOL PROPIONATE 0.05 % EX FOAM: Freq: Two times a day (BID) | CUTANEOUS | 1 refills | Status: DC

## 2023-10-29 NOTE — Progress Notes (Unsigned)
   Established Patient Office Visit  Subjective   Patient ID: Emily Winters, female    DOB: 09/24/1976  Age: 47 y.o. MRN: [id number]  No chief complaint on file.   HPI  {History (Optional):23778}  ROS    Objective:     There were no vitals taken for this visit. {Vitals History (Optional):23777}  Physical Exam   No results found for any visits on 10/29/23.  {Labs (Optional):23779}  The 10-year ASCVD risk score (Arnett DK, et al., 2019) is: 0.9%    Assessment & Plan:   Problem List Items Addressed This Visit   None   No follow-ups on file.    Tandy Gaw, PA-C

## 2023-10-29 NOTE — Progress Notes (Unsigned)
Acute Office Visit  Subjective:     Patient ID: Emily Winters, female    DOB: 06/11/1976, 47 y.o.   MRN: [id number]  Chief Complaint  Patient presents with   Hair/Scalp Problem    HPI Patient is in today for hair loss starting at end of August. A couple months before it started, she stopped taking her omega 3 and multivitamin. She visited a functional doctor on 09/02/23 who got labs which showed increased ferritin, but normal estradiol, progesterone, FSH, testosterone, TSH, and vitamin D. She re-started taking her vitamins about 2 months ago. She notes new growth with baby hairs, but she is still losing hair. Hair loss is greatest in the back of her head. She also reports that her hair is also more dry in texture. Her scalp also feels dry, itchy, and warm. Recent stressor of grandchild's death. Losing hair also makes her stressed. She has been using a natural oil with rosemary to try to help. She washes her hair every 4 days. Denies patches of hair loss, change in hair products, joint pain. She is experiencing problems with mold in her bathroom and also reports constant congestion.  Review of Systems  HENT:  Positive for congestion.   Skin:  Positive for itching.      Objective:    BP 112/69   Pulse 75   Temp 98.8 F (37.1 C)   Resp 20   Ht 5\' 2"  (1.575 m)   Wt 58.4 kg   SpO2 100%   BMI 23.54 kg/m    Physical Exam HENT:     Head:     Comments: Scalp showed no scales, erythema, or patches of hair loss.      Assessment & Plan:   Problem List Items Addressed This Visit   None Visit Diagnoses     Hair loss    -  Primary   Relevant Medications   VITAMIN D, CHOLECALCIFEROL, PO   VITAMIN K PO   B Complex Vitamins (VITAMIN B COMPLEX PO)   clobetasol (OLUX) 0.05 % topical foam   Other Relevant Orders   Fe+TIBC+Fer   Sed Rate (ESR)   C-reactive protein   Hepatic function panel   B12 and Folate Panel   VITAMIN D 25 Hydroxy (Vit-D Deficiency, Fractures)   Iron,  TIBC and Ferritin Panel (Completed)   CBC and differential (Completed)   CBC (Completed)   Basic metabolic panel (Completed)   Comprehensive metabolic panel (Completed)   Lipid panel (Completed)   Hepatic function panel (Completed)   Vitamin B12 (Completed)   TSH (Completed)   VITAMIN D 25 Hydroxy (Vit-D Deficiency, Fractures) (Completed)   Testosterone (Completed)   Visit for screening mammogram       Relevant Orders   MM 3D SCREENING MAMMOGRAM BILATERAL BREAST   Vitamin D insufficiency       Relevant Medications   VITAMIN D, CHOLECALCIFEROL, PO   Other Relevant Orders   VITAMIN D 25 Hydroxy (Vit-D Deficiency, Fractures)   VITAMIN D 25 Hydroxy (Vit-D Deficiency, Fractures) (Completed)   Low serum vitamin B12       Relevant Medications   B Complex Vitamins (VITAMIN B COMPLEX PO)   Other Relevant Orders   B12 and Folate Panel       Meds ordered this encounter  Medications   DISCONTD: clobetasol (OLUX) 0.05 % topical foam    Sig: Apply topically 2 (two) times daily. As needed.    Dispense:  100 g    Refill:  1    Order Specific Question:   Supervising Provider    Answer:   [employer] [[id number]]   clobetasol (OLUX) 0.05 % topical foam    Sig: Apply topically 2 (two) times daily. As needed.    Dispense:  100 g    Refill:  1   Due to no patches of hair loss, autoimmune etiology less likely. 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 Advised patient to stay on her current vitamins. Ordered inflammatory markers. If markers high, consider tumeric. Prescribed clobetasol foam and advised patient to rub it in and to not rinse it out.  Recommended eradicating mold problem in bathroom. Zyrtec or Claritin for congestion.  Pt declined COVID and Flu vaccines. Referral for mammogram.  No follow-ups on file.  AnnaCollin M Mace, Student-PA

## 2023-10-30 ENCOUNTER — Encounter: Payer: Self-pay | Admitting: Physician Assistant

## 2023-10-30 DIAGNOSIS — L299 Pruritus, unspecified: Secondary | ICD-10-CM | POA: Insufficient documentation

## 2023-10-30 DIAGNOSIS — E538 Deficiency of other specified B group vitamins: Secondary | ICD-10-CM | POA: Insufficient documentation

## 2023-10-30 DIAGNOSIS — Z77120 Contact with and (suspected) exposure to mold (toxic): Secondary | ICD-10-CM | POA: Insufficient documentation

## 2023-10-30 DIAGNOSIS — L659 Nonscarring hair loss, unspecified: Secondary | ICD-10-CM | POA: Insufficient documentation

## 2023-10-30 DIAGNOSIS — E559 Vitamin D deficiency, unspecified: Secondary | ICD-10-CM | POA: Insufficient documentation

## 2023-10-30 DIAGNOSIS — R7989 Other specified abnormal findings of blood chemistry: Secondary | ICD-10-CM | POA: Insufficient documentation

## 2023-10-30 LAB — B12 AND FOLATE PANEL
Folate: 15.6 ng/mL (ref >3.0)
Vitamin B-12: 585 pg/mL (ref 232–1245)

## 2023-10-30 LAB — IRON,TIBC AND FERRITIN PANEL
Ferritin: 208 ng/mL — ABNORMAL HIGH (ref 15–150)
Iron Saturation: 29 % (ref 15–55)
Iron: 80 ug/dL (ref 27–159)
Total Iron Binding Capacity: 272 ug/dL (ref 250–450)
UIBC: 192 ug/dL (ref 131–425)

## 2023-10-30 LAB — HEPATIC FUNCTION PANEL
ALT: 17 [IU]/L (ref 0–32)
AST: 23 [IU]/L (ref 0–40)
Albumin: 4.7 g/dL (ref 3.9–4.9)
Alkaline Phosphatase: 67 [IU]/L (ref 44–121)
Bilirubin Total: 1.7 mg/dL — ABNORMAL HIGH (ref 0.0–1.2)
Bilirubin, Direct: 0.4 mg/dL (ref 0.00–0.40)
Total Protein: 7 g/dL (ref 6.0–8.5)

## 2023-10-30 LAB — VITAMIN D 25 HYDROXY (VIT D DEFICIENCY, FRACTURES): Vit D, 25-Hydroxy: 46.7 ng/mL (ref 30.0–100.0)

## 2023-10-30 LAB — SEDIMENTATION RATE: Sed Rate: 2 mm/h (ref 0–32)

## 2023-10-30 LAB — C-REACTIVE PROTEIN: CRP: <1 mg/L (ref 0–10)

## 2023-10-30 NOTE — Progress Notes (Signed)
Emily Winters,   Ferritin is coming down some but still a little elevated. Iron looks good so do not take any extra iron in supplements. Liver enzymes look great. Inflammatory markers are normal. B12 looks great. Vitamin D looks wonderful.   Do you drink alcohol?   Not overly concerned with ferritin but would like to recheck lab only in 2 months.

## 2023-12-15 ENCOUNTER — Encounter: Payer: Self-pay | Admitting: Physician Assistant

## 2023-12-19 ENCOUNTER — Other Ambulatory Visit: Payer: Self-pay | Admitting: Physician Assistant

## 2023-12-19 DIAGNOSIS — Z1231 Encounter for screening mammogram for malignant neoplasm of breast: Secondary | ICD-10-CM

## 2024-01-01 ENCOUNTER — Other Ambulatory Visit: Payer: Self-pay | Admitting: Physician Assistant

## 2024-01-01 ENCOUNTER — Ambulatory Visit: Payer: Commercial Managed Care - PPO

## 2024-01-01 DIAGNOSIS — Z1231 Encounter for screening mammogram for malignant neoplasm of breast: Secondary | ICD-10-CM

## 2024-01-03 ENCOUNTER — Encounter: Payer: Self-pay | Admitting: Physician Assistant

## 2024-01-03 NOTE — Progress Notes (Signed)
 Normal mammogram. Follow up in 1 year.

## 2024-10-23 ENCOUNTER — Other Ambulatory Visit: Payer: Self-pay | Admitting: Physician Assistant

## 2024-10-23 DIAGNOSIS — Z1231 Encounter for screening mammogram for malignant neoplasm of breast: Secondary | ICD-10-CM
